# Patient Record
Sex: Female | Born: 1935 | Race: White | Hispanic: No | Smoking: Never smoker
Health system: Southern US, Community
[De-identification: ages and names within clinical notes are randomized; demographics above are authoritative.]

## PROBLEM LIST (undated history)

## (undated) DIAGNOSIS — F250 Schizoaffective disorder, bipolar type: Secondary | ICD-10-CM

## (undated) DIAGNOSIS — E7800 Pure hypercholesterolemia, unspecified: Secondary | ICD-10-CM

## (undated) DIAGNOSIS — F39 Unspecified mood [affective] disorder: Secondary | ICD-10-CM

## (undated) DIAGNOSIS — G20A1 Parkinson's disease without dyskinesia, without mention of fluctuations: Secondary | ICD-10-CM

## (undated) DIAGNOSIS — I639 Cerebral infarction, unspecified: Secondary | ICD-10-CM

## (undated) DIAGNOSIS — G20 Parkinson's disease: Secondary | ICD-10-CM

## (undated) DIAGNOSIS — I10 Essential (primary) hypertension: Secondary | ICD-10-CM

## (undated) DIAGNOSIS — I4891 Unspecified atrial fibrillation: Secondary | ICD-10-CM

## (undated) DIAGNOSIS — F0390 Unspecified dementia without behavioral disturbance: Secondary | ICD-10-CM

## (undated) DIAGNOSIS — G4700 Insomnia, unspecified: Secondary | ICD-10-CM

## (undated) DIAGNOSIS — F339 Major depressive disorder, recurrent, unspecified: Secondary | ICD-10-CM

## (undated) DIAGNOSIS — N289 Disorder of kidney and ureter, unspecified: Secondary | ICD-10-CM

## (undated) HISTORY — PX: VAGINAL HYSTERECTOMY: SUR661

---

## 2005-03-27 ENCOUNTER — Ambulatory Visit: Payer: Self-pay | Admitting: Family Medicine

## 2005-04-18 ENCOUNTER — Emergency Department: Payer: Self-pay | Admitting: Emergency Medicine

## 2005-07-24 ENCOUNTER — Ambulatory Visit: Payer: Self-pay | Admitting: Psychiatry

## 2006-02-26 ENCOUNTER — Ambulatory Visit: Payer: Self-pay | Admitting: Family Medicine

## 2006-03-11 ENCOUNTER — Ambulatory Visit: Payer: Self-pay | Admitting: Internal Medicine

## 2006-03-11 ENCOUNTER — Ambulatory Visit: Payer: Self-pay | Admitting: Family Medicine

## 2007-02-16 ENCOUNTER — Emergency Department (HOSPITAL_COMMUNITY): Admission: EM | Admit: 2007-02-16 | Discharge: 2007-02-16 | Payer: Self-pay | Admitting: Emergency Medicine

## 2007-03-31 ENCOUNTER — Ambulatory Visit: Payer: Self-pay | Admitting: Family Medicine

## 2007-08-31 ENCOUNTER — Ambulatory Visit: Payer: Self-pay | Admitting: Podiatry

## 2007-09-03 ENCOUNTER — Ambulatory Visit: Payer: Self-pay | Admitting: Podiatry

## 2007-09-14 ENCOUNTER — Ambulatory Visit: Payer: Self-pay | Admitting: Neurology

## 2009-01-04 ENCOUNTER — Ambulatory Visit: Payer: Self-pay | Admitting: Family Medicine

## 2009-01-10 ENCOUNTER — Ambulatory Visit: Payer: Self-pay | Admitting: Family Medicine

## 2009-02-12 ENCOUNTER — Ambulatory Visit: Payer: Self-pay | Admitting: Gastroenterology

## 2010-10-18 LAB — URINALYSIS, ROUTINE W REFLEX MICROSCOPIC
Glucose, UA: NEGATIVE
Ketones, ur: NEGATIVE
pH: 6.5

## 2010-10-18 LAB — COMPREHENSIVE METABOLIC PANEL
ALT: 31
AST: 92 — ABNORMAL HIGH
Albumin: 3.8
Alkaline Phosphatase: 39
Chloride: 93 — ABNORMAL LOW
Potassium: 4.1
Sodium: 128 — ABNORMAL LOW
Total Bilirubin: 0.8

## 2010-10-18 LAB — DIFFERENTIAL
Basophils Relative: 0
Eosinophils Absolute: 0
Eosinophils Relative: 0
Monocytes Absolute: 1
Monocytes Relative: 8
Neutro Abs: 10.1 — ABNORMAL HIGH

## 2010-10-18 LAB — CBC
Platelets: 229
RDW: 13.1

## 2010-10-18 LAB — URINE MICROSCOPIC-ADD ON

## 2011-02-05 DIAGNOSIS — G4710 Hypersomnia, unspecified: Secondary | ICD-10-CM | POA: Diagnosis not present

## 2011-02-13 DIAGNOSIS — H902 Conductive hearing loss, unspecified: Secondary | ICD-10-CM | POA: Diagnosis not present

## 2011-02-13 DIAGNOSIS — G4733 Obstructive sleep apnea (adult) (pediatric): Secondary | ICD-10-CM | POA: Diagnosis not present

## 2011-02-13 DIAGNOSIS — H6120 Impacted cerumen, unspecified ear: Secondary | ICD-10-CM | POA: Diagnosis not present

## 2011-02-20 DIAGNOSIS — R0602 Shortness of breath: Secondary | ICD-10-CM | POA: Diagnosis not present

## 2011-02-20 DIAGNOSIS — J449 Chronic obstructive pulmonary disease, unspecified: Secondary | ICD-10-CM | POA: Diagnosis not present

## 2011-02-20 DIAGNOSIS — G4720 Circadian rhythm sleep disorder, unspecified type: Secondary | ICD-10-CM | POA: Diagnosis not present

## 2011-02-20 DIAGNOSIS — G4730 Sleep apnea, unspecified: Secondary | ICD-10-CM | POA: Diagnosis not present

## 2011-02-21 DIAGNOSIS — G4730 Sleep apnea, unspecified: Secondary | ICD-10-CM | POA: Diagnosis not present

## 2011-02-21 DIAGNOSIS — G4720 Circadian rhythm sleep disorder, unspecified type: Secondary | ICD-10-CM | POA: Diagnosis not present

## 2011-02-24 DIAGNOSIS — I509 Heart failure, unspecified: Secondary | ICD-10-CM | POA: Diagnosis not present

## 2011-02-24 DIAGNOSIS — I4891 Unspecified atrial fibrillation: Secondary | ICD-10-CM | POA: Diagnosis not present

## 2011-02-24 DIAGNOSIS — E039 Hypothyroidism, unspecified: Secondary | ICD-10-CM | POA: Diagnosis not present

## 2011-02-25 DIAGNOSIS — F0390 Unspecified dementia without behavioral disturbance: Secondary | ICD-10-CM | POA: Diagnosis not present

## 2011-02-25 DIAGNOSIS — I129 Hypertensive chronic kidney disease with stage 1 through stage 4 chronic kidney disease, or unspecified chronic kidney disease: Secondary | ICD-10-CM | POA: Diagnosis not present

## 2011-03-03 DIAGNOSIS — Z79899 Other long term (current) drug therapy: Secondary | ICD-10-CM | POA: Diagnosis not present

## 2011-03-12 DIAGNOSIS — H4010X0 Unspecified open-angle glaucoma, stage unspecified: Secondary | ICD-10-CM | POA: Diagnosis not present

## 2011-03-12 DIAGNOSIS — H4011X0 Primary open-angle glaucoma, stage unspecified: Secondary | ICD-10-CM | POA: Diagnosis not present

## 2011-03-20 DIAGNOSIS — F314 Bipolar disorder, current episode depressed, severe, without psychotic features: Secondary | ICD-10-CM | POA: Diagnosis not present

## 2011-03-24 DIAGNOSIS — Z79899 Other long term (current) drug therapy: Secondary | ICD-10-CM | POA: Diagnosis not present

## 2011-03-25 DIAGNOSIS — J449 Chronic obstructive pulmonary disease, unspecified: Secondary | ICD-10-CM | POA: Diagnosis not present

## 2011-03-25 DIAGNOSIS — R0602 Shortness of breath: Secondary | ICD-10-CM | POA: Diagnosis not present

## 2011-03-25 DIAGNOSIS — G4730 Sleep apnea, unspecified: Secondary | ICD-10-CM | POA: Diagnosis not present

## 2011-03-25 DIAGNOSIS — G4720 Circadian rhythm sleep disorder, unspecified type: Secondary | ICD-10-CM | POA: Diagnosis not present

## 2011-04-03 DIAGNOSIS — I10 Essential (primary) hypertension: Secondary | ICD-10-CM | POA: Diagnosis not present

## 2011-04-03 DIAGNOSIS — I509 Heart failure, unspecified: Secondary | ICD-10-CM | POA: Diagnosis not present

## 2011-04-03 DIAGNOSIS — I4891 Unspecified atrial fibrillation: Secondary | ICD-10-CM | POA: Diagnosis not present

## 2011-04-03 DIAGNOSIS — I359 Nonrheumatic aortic valve disorder, unspecified: Secondary | ICD-10-CM | POA: Diagnosis not present

## 2011-04-10 DIAGNOSIS — I359 Nonrheumatic aortic valve disorder, unspecified: Secondary | ICD-10-CM | POA: Diagnosis not present

## 2011-04-10 DIAGNOSIS — I059 Rheumatic mitral valve disease, unspecified: Secondary | ICD-10-CM | POA: Diagnosis not present

## 2011-04-10 DIAGNOSIS — I4891 Unspecified atrial fibrillation: Secondary | ICD-10-CM | POA: Diagnosis not present

## 2011-04-10 DIAGNOSIS — I6350 Cerebral infarction due to unspecified occlusion or stenosis of unspecified cerebral artery: Secondary | ICD-10-CM | POA: Diagnosis not present

## 2011-05-07 DIAGNOSIS — G4710 Hypersomnia, unspecified: Secondary | ICD-10-CM | POA: Diagnosis not present

## 2011-05-13 DIAGNOSIS — I059 Rheumatic mitral valve disease, unspecified: Secondary | ICD-10-CM | POA: Diagnosis not present

## 2011-05-13 DIAGNOSIS — I4891 Unspecified atrial fibrillation: Secondary | ICD-10-CM | POA: Diagnosis not present

## 2011-05-13 DIAGNOSIS — I359 Nonrheumatic aortic valve disorder, unspecified: Secondary | ICD-10-CM | POA: Diagnosis not present

## 2011-05-13 DIAGNOSIS — I5020 Unspecified systolic (congestive) heart failure: Secondary | ICD-10-CM | POA: Diagnosis not present

## 2011-05-14 DIAGNOSIS — I4891 Unspecified atrial fibrillation: Secondary | ICD-10-CM | POA: Diagnosis not present

## 2011-05-14 DIAGNOSIS — I6350 Cerebral infarction due to unspecified occlusion or stenosis of unspecified cerebral artery: Secondary | ICD-10-CM | POA: Diagnosis not present

## 2011-05-14 DIAGNOSIS — I639 Cerebral infarction, unspecified: Secondary | ICD-10-CM | POA: Insufficient documentation

## 2011-05-14 DIAGNOSIS — E785 Hyperlipidemia, unspecified: Secondary | ICD-10-CM | POA: Diagnosis not present

## 2011-05-20 DIAGNOSIS — F314 Bipolar disorder, current episode depressed, severe, without psychotic features: Secondary | ICD-10-CM | POA: Diagnosis not present

## 2011-05-22 DIAGNOSIS — Z79899 Other long term (current) drug therapy: Secondary | ICD-10-CM | POA: Diagnosis not present

## 2011-05-22 DIAGNOSIS — F314 Bipolar disorder, current episode depressed, severe, without psychotic features: Secondary | ICD-10-CM | POA: Diagnosis not present

## 2011-06-05 DIAGNOSIS — I129 Hypertensive chronic kidney disease with stage 1 through stage 4 chronic kidney disease, or unspecified chronic kidney disease: Secondary | ICD-10-CM | POA: Diagnosis not present

## 2011-06-05 DIAGNOSIS — F0390 Unspecified dementia without behavioral disturbance: Secondary | ICD-10-CM | POA: Diagnosis not present

## 2011-06-30 DIAGNOSIS — E119 Type 2 diabetes mellitus without complications: Secondary | ICD-10-CM | POA: Diagnosis not present

## 2011-06-30 DIAGNOSIS — Z79899 Other long term (current) drug therapy: Secondary | ICD-10-CM | POA: Diagnosis not present

## 2011-06-30 DIAGNOSIS — E782 Mixed hyperlipidemia: Secondary | ICD-10-CM | POA: Diagnosis not present

## 2011-06-30 DIAGNOSIS — D649 Anemia, unspecified: Secondary | ICD-10-CM | POA: Diagnosis not present

## 2011-07-08 DIAGNOSIS — R6889 Other general symptoms and signs: Secondary | ICD-10-CM | POA: Diagnosis not present

## 2011-08-27 DIAGNOSIS — G4710 Hypersomnia, unspecified: Secondary | ICD-10-CM | POA: Diagnosis not present

## 2011-09-01 DIAGNOSIS — D649 Anemia, unspecified: Secondary | ICD-10-CM | POA: Diagnosis not present

## 2011-09-01 DIAGNOSIS — E039 Hypothyroidism, unspecified: Secondary | ICD-10-CM | POA: Diagnosis not present

## 2011-09-01 DIAGNOSIS — Z79899 Other long term (current) drug therapy: Secondary | ICD-10-CM | POA: Diagnosis not present

## 2011-09-09 DIAGNOSIS — F314 Bipolar disorder, current episode depressed, severe, without psychotic features: Secondary | ICD-10-CM | POA: Diagnosis not present

## 2011-09-11 DIAGNOSIS — E039 Hypothyroidism, unspecified: Secondary | ICD-10-CM | POA: Diagnosis not present

## 2011-09-11 DIAGNOSIS — Z79899 Other long term (current) drug therapy: Secondary | ICD-10-CM | POA: Diagnosis not present

## 2011-09-15 DIAGNOSIS — R011 Cardiac murmur, unspecified: Secondary | ICD-10-CM | POA: Diagnosis not present

## 2011-09-15 DIAGNOSIS — I4891 Unspecified atrial fibrillation: Secondary | ICD-10-CM | POA: Diagnosis not present

## 2011-09-15 DIAGNOSIS — I359 Nonrheumatic aortic valve disorder, unspecified: Secondary | ICD-10-CM | POA: Diagnosis not present

## 2011-09-15 DIAGNOSIS — Z79899 Other long term (current) drug therapy: Secondary | ICD-10-CM | POA: Diagnosis not present

## 2011-09-15 DIAGNOSIS — I509 Heart failure, unspecified: Secondary | ICD-10-CM | POA: Diagnosis not present

## 2011-09-15 DIAGNOSIS — I059 Rheumatic mitral valve disease, unspecified: Secondary | ICD-10-CM | POA: Diagnosis not present

## 2011-09-16 DIAGNOSIS — H4010X0 Unspecified open-angle glaucoma, stage unspecified: Secondary | ICD-10-CM | POA: Diagnosis not present

## 2011-09-16 DIAGNOSIS — H4011X0 Primary open-angle glaucoma, stage unspecified: Secondary | ICD-10-CM | POA: Diagnosis not present

## 2011-09-22 DIAGNOSIS — J449 Chronic obstructive pulmonary disease, unspecified: Secondary | ICD-10-CM | POA: Diagnosis not present

## 2011-09-22 DIAGNOSIS — G4730 Sleep apnea, unspecified: Secondary | ICD-10-CM | POA: Diagnosis not present

## 2011-09-22 DIAGNOSIS — G4720 Circadian rhythm sleep disorder, unspecified type: Secondary | ICD-10-CM | POA: Diagnosis not present

## 2011-11-05 DIAGNOSIS — G4730 Sleep apnea, unspecified: Secondary | ICD-10-CM | POA: Diagnosis not present

## 2011-11-05 DIAGNOSIS — G4710 Hypersomnia, unspecified: Secondary | ICD-10-CM | POA: Diagnosis not present

## 2011-11-11 DIAGNOSIS — F314 Bipolar disorder, current episode depressed, severe, without psychotic features: Secondary | ICD-10-CM | POA: Diagnosis not present

## 2011-11-13 DIAGNOSIS — Z79899 Other long term (current) drug therapy: Secondary | ICD-10-CM | POA: Diagnosis not present

## 2011-11-13 DIAGNOSIS — D649 Anemia, unspecified: Secondary | ICD-10-CM | POA: Diagnosis not present

## 2011-11-25 DIAGNOSIS — I4891 Unspecified atrial fibrillation: Secondary | ICD-10-CM | POA: Diagnosis not present

## 2011-11-25 DIAGNOSIS — I6350 Cerebral infarction due to unspecified occlusion or stenosis of unspecified cerebral artery: Secondary | ICD-10-CM | POA: Diagnosis not present

## 2011-11-25 DIAGNOSIS — R0602 Shortness of breath: Secondary | ICD-10-CM | POA: Diagnosis not present

## 2011-11-25 DIAGNOSIS — I2510 Atherosclerotic heart disease of native coronary artery without angina pectoris: Secondary | ICD-10-CM | POA: Diagnosis not present

## 2011-11-25 DIAGNOSIS — I059 Rheumatic mitral valve disease, unspecified: Secondary | ICD-10-CM | POA: Diagnosis not present

## 2011-11-25 DIAGNOSIS — R609 Edema, unspecified: Secondary | ICD-10-CM | POA: Diagnosis not present

## 2011-11-25 DIAGNOSIS — E785 Hyperlipidemia, unspecified: Secondary | ICD-10-CM | POA: Diagnosis not present

## 2011-11-25 DIAGNOSIS — I423 Endomyocardial (eosinophilic) disease: Secondary | ICD-10-CM | POA: Diagnosis not present

## 2011-12-02 DIAGNOSIS — R0602 Shortness of breath: Secondary | ICD-10-CM | POA: Diagnosis not present

## 2011-12-02 DIAGNOSIS — I428 Other cardiomyopathies: Secondary | ICD-10-CM | POA: Diagnosis not present

## 2011-12-02 DIAGNOSIS — I509 Heart failure, unspecified: Secondary | ICD-10-CM | POA: Diagnosis not present

## 2012-01-01 DIAGNOSIS — Z79899 Other long term (current) drug therapy: Secondary | ICD-10-CM | POA: Diagnosis not present

## 2012-02-02 DIAGNOSIS — F314 Bipolar disorder, current episode depressed, severe, without psychotic features: Secondary | ICD-10-CM | POA: Diagnosis not present

## 2012-03-01 DIAGNOSIS — F329 Major depressive disorder, single episode, unspecified: Secondary | ICD-10-CM | POA: Diagnosis not present

## 2012-03-01 DIAGNOSIS — Z0000 Encounter for general adult medical examination without abnormal findings: Secondary | ICD-10-CM | POA: Diagnosis not present

## 2012-03-01 DIAGNOSIS — G4700 Insomnia, unspecified: Secondary | ICD-10-CM | POA: Diagnosis not present

## 2012-03-01 DIAGNOSIS — E890 Postprocedural hypothyroidism: Secondary | ICD-10-CM | POA: Diagnosis not present

## 2012-03-01 DIAGNOSIS — Z79899 Other long term (current) drug therapy: Secondary | ICD-10-CM | POA: Diagnosis not present

## 2012-03-01 DIAGNOSIS — F39 Unspecified mood [affective] disorder: Secondary | ICD-10-CM | POA: Diagnosis not present

## 2012-03-01 DIAGNOSIS — E7800 Pure hypercholesterolemia, unspecified: Secondary | ICD-10-CM | POA: Diagnosis not present

## 2012-03-05 DIAGNOSIS — G4700 Insomnia, unspecified: Secondary | ICD-10-CM | POA: Diagnosis not present

## 2012-03-05 DIAGNOSIS — F39 Unspecified mood [affective] disorder: Secondary | ICD-10-CM | POA: Diagnosis not present

## 2012-03-05 DIAGNOSIS — F329 Major depressive disorder, single episode, unspecified: Secondary | ICD-10-CM | POA: Diagnosis not present

## 2012-03-13 DIAGNOSIS — F0390 Unspecified dementia without behavioral disturbance: Secondary | ICD-10-CM | POA: Diagnosis not present

## 2012-03-13 DIAGNOSIS — I129 Hypertensive chronic kidney disease with stage 1 through stage 4 chronic kidney disease, or unspecified chronic kidney disease: Secondary | ICD-10-CM | POA: Diagnosis not present

## 2012-03-22 DIAGNOSIS — Z006 Encounter for examination for normal comparison and control in clinical research program: Secondary | ICD-10-CM | POA: Diagnosis not present

## 2012-03-22 DIAGNOSIS — G4730 Sleep apnea, unspecified: Secondary | ICD-10-CM | POA: Diagnosis not present

## 2012-03-22 DIAGNOSIS — I4891 Unspecified atrial fibrillation: Secondary | ICD-10-CM | POA: Diagnosis not present

## 2012-03-22 DIAGNOSIS — G4710 Hypersomnia, unspecified: Secondary | ICD-10-CM | POA: Diagnosis not present

## 2012-03-31 DIAGNOSIS — H4011X0 Primary open-angle glaucoma, stage unspecified: Secondary | ICD-10-CM | POA: Diagnosis not present

## 2012-03-31 DIAGNOSIS — Z961 Presence of intraocular lens: Secondary | ICD-10-CM | POA: Diagnosis not present

## 2012-04-02 DIAGNOSIS — I359 Nonrheumatic aortic valve disorder, unspecified: Secondary | ICD-10-CM | POA: Diagnosis not present

## 2012-04-02 DIAGNOSIS — I4891 Unspecified atrial fibrillation: Secondary | ICD-10-CM | POA: Diagnosis not present

## 2012-04-02 DIAGNOSIS — I059 Rheumatic mitral valve disease, unspecified: Secondary | ICD-10-CM | POA: Diagnosis not present

## 2012-04-02 DIAGNOSIS — I10 Essential (primary) hypertension: Secondary | ICD-10-CM | POA: Diagnosis not present

## 2012-04-08 DIAGNOSIS — R072 Precordial pain: Secondary | ICD-10-CM | POA: Diagnosis not present

## 2012-04-12 DIAGNOSIS — I059 Rheumatic mitral valve disease, unspecified: Secondary | ICD-10-CM | POA: Diagnosis not present

## 2012-04-12 DIAGNOSIS — I509 Heart failure, unspecified: Secondary | ICD-10-CM | POA: Diagnosis not present

## 2012-04-12 DIAGNOSIS — I4891 Unspecified atrial fibrillation: Secondary | ICD-10-CM | POA: Diagnosis not present

## 2012-04-12 DIAGNOSIS — I10 Essential (primary) hypertension: Secondary | ICD-10-CM | POA: Diagnosis not present

## 2012-04-16 DIAGNOSIS — I10 Essential (primary) hypertension: Secondary | ICD-10-CM | POA: Diagnosis not present

## 2012-04-16 DIAGNOSIS — R9430 Abnormal result of cardiovascular function study, unspecified: Secondary | ICD-10-CM | POA: Diagnosis not present

## 2012-04-16 DIAGNOSIS — I509 Heart failure, unspecified: Secondary | ICD-10-CM | POA: Diagnosis not present

## 2012-04-16 DIAGNOSIS — Z79899 Other long term (current) drug therapy: Secondary | ICD-10-CM | POA: Diagnosis not present

## 2012-04-16 DIAGNOSIS — I4891 Unspecified atrial fibrillation: Secondary | ICD-10-CM | POA: Diagnosis not present

## 2012-04-20 DIAGNOSIS — I059 Rheumatic mitral valve disease, unspecified: Secondary | ICD-10-CM | POA: Diagnosis not present

## 2012-04-20 DIAGNOSIS — I4891 Unspecified atrial fibrillation: Secondary | ICD-10-CM | POA: Diagnosis not present

## 2012-04-20 DIAGNOSIS — I509 Heart failure, unspecified: Secondary | ICD-10-CM | POA: Diagnosis not present

## 2012-04-20 DIAGNOSIS — I359 Nonrheumatic aortic valve disorder, unspecified: Secondary | ICD-10-CM | POA: Diagnosis not present

## 2012-04-21 DIAGNOSIS — H4010X0 Unspecified open-angle glaucoma, stage unspecified: Secondary | ICD-10-CM | POA: Diagnosis not present

## 2012-04-21 DIAGNOSIS — H4011X0 Primary open-angle glaucoma, stage unspecified: Secondary | ICD-10-CM | POA: Diagnosis not present

## 2012-04-28 DIAGNOSIS — F314 Bipolar disorder, current episode depressed, severe, without psychotic features: Secondary | ICD-10-CM | POA: Diagnosis not present

## 2012-05-10 DIAGNOSIS — F39 Unspecified mood [affective] disorder: Secondary | ICD-10-CM | POA: Diagnosis not present

## 2012-05-10 DIAGNOSIS — G4700 Insomnia, unspecified: Secondary | ICD-10-CM | POA: Diagnosis not present

## 2012-05-10 DIAGNOSIS — F329 Major depressive disorder, single episode, unspecified: Secondary | ICD-10-CM | POA: Diagnosis not present

## 2012-06-28 DIAGNOSIS — Z0000 Encounter for general adult medical examination without abnormal findings: Secondary | ICD-10-CM | POA: Diagnosis not present

## 2012-06-28 DIAGNOSIS — Z79899 Other long term (current) drug therapy: Secondary | ICD-10-CM | POA: Diagnosis not present

## 2012-08-02 DIAGNOSIS — F39 Unspecified mood [affective] disorder: Secondary | ICD-10-CM | POA: Diagnosis not present

## 2012-08-13 DIAGNOSIS — F39 Unspecified mood [affective] disorder: Secondary | ICD-10-CM | POA: Diagnosis not present

## 2012-08-26 DIAGNOSIS — Z79899 Other long term (current) drug therapy: Secondary | ICD-10-CM | POA: Diagnosis not present

## 2012-08-26 DIAGNOSIS — F314 Bipolar disorder, current episode depressed, severe, without psychotic features: Secondary | ICD-10-CM | POA: Diagnosis not present

## 2012-08-31 DIAGNOSIS — Z79899 Other long term (current) drug therapy: Secondary | ICD-10-CM | POA: Diagnosis not present

## 2012-08-31 DIAGNOSIS — Z0000 Encounter for general adult medical examination without abnormal findings: Secondary | ICD-10-CM | POA: Diagnosis not present

## 2012-09-15 DIAGNOSIS — B351 Tinea unguium: Secondary | ICD-10-CM | POA: Diagnosis not present

## 2012-09-15 DIAGNOSIS — E119 Type 2 diabetes mellitus without complications: Secondary | ICD-10-CM | POA: Diagnosis not present

## 2012-09-18 DIAGNOSIS — I129 Hypertensive chronic kidney disease with stage 1 through stage 4 chronic kidney disease, or unspecified chronic kidney disease: Secondary | ICD-10-CM | POA: Diagnosis not present

## 2012-09-18 DIAGNOSIS — F0390 Unspecified dementia without behavioral disturbance: Secondary | ICD-10-CM | POA: Diagnosis not present

## 2012-10-13 DIAGNOSIS — Z961 Presence of intraocular lens: Secondary | ICD-10-CM | POA: Diagnosis not present

## 2012-10-13 DIAGNOSIS — H4011X0 Primary open-angle glaucoma, stage unspecified: Secondary | ICD-10-CM | POA: Diagnosis not present

## 2012-11-01 DIAGNOSIS — D649 Anemia, unspecified: Secondary | ICD-10-CM | POA: Diagnosis not present

## 2012-11-01 DIAGNOSIS — Z79899 Other long term (current) drug therapy: Secondary | ICD-10-CM | POA: Diagnosis not present

## 2012-11-01 DIAGNOSIS — F329 Major depressive disorder, single episode, unspecified: Secondary | ICD-10-CM | POA: Diagnosis not present

## 2012-11-01 DIAGNOSIS — E039 Hypothyroidism, unspecified: Secondary | ICD-10-CM | POA: Diagnosis not present

## 2012-11-01 DIAGNOSIS — F39 Unspecified mood [affective] disorder: Secondary | ICD-10-CM | POA: Diagnosis not present

## 2012-11-01 DIAGNOSIS — Z0000 Encounter for general adult medical examination without abnormal findings: Secondary | ICD-10-CM | POA: Diagnosis not present

## 2012-11-01 DIAGNOSIS — G4700 Insomnia, unspecified: Secondary | ICD-10-CM | POA: Diagnosis not present

## 2012-11-24 DIAGNOSIS — G4710 Hypersomnia, unspecified: Secondary | ICD-10-CM | POA: Diagnosis not present

## 2012-11-25 DIAGNOSIS — Z23 Encounter for immunization: Secondary | ICD-10-CM | POA: Diagnosis not present

## 2012-11-25 DIAGNOSIS — F0280 Dementia in other diseases classified elsewhere without behavioral disturbance: Secondary | ICD-10-CM | POA: Diagnosis not present

## 2012-11-29 DIAGNOSIS — G4700 Insomnia, unspecified: Secondary | ICD-10-CM | POA: Diagnosis not present

## 2012-11-29 DIAGNOSIS — F329 Major depressive disorder, single episode, unspecified: Secondary | ICD-10-CM | POA: Diagnosis not present

## 2012-11-29 DIAGNOSIS — F39 Unspecified mood [affective] disorder: Secondary | ICD-10-CM | POA: Diagnosis not present

## 2012-12-02 DIAGNOSIS — D649 Anemia, unspecified: Secondary | ICD-10-CM | POA: Diagnosis not present

## 2012-12-02 DIAGNOSIS — Z0000 Encounter for general adult medical examination without abnormal findings: Secondary | ICD-10-CM | POA: Diagnosis not present

## 2012-12-02 DIAGNOSIS — Z79899 Other long term (current) drug therapy: Secondary | ICD-10-CM | POA: Diagnosis not present

## 2012-12-16 DIAGNOSIS — F314 Bipolar disorder, current episode depressed, severe, without psychotic features: Secondary | ICD-10-CM | POA: Diagnosis not present

## 2013-01-03 DIAGNOSIS — E7800 Pure hypercholesterolemia, unspecified: Secondary | ICD-10-CM | POA: Diagnosis not present

## 2013-01-03 DIAGNOSIS — Z0000 Encounter for general adult medical examination without abnormal findings: Secondary | ICD-10-CM | POA: Diagnosis not present

## 2013-01-03 DIAGNOSIS — D649 Anemia, unspecified: Secondary | ICD-10-CM | POA: Diagnosis not present

## 2013-01-03 DIAGNOSIS — E119 Type 2 diabetes mellitus without complications: Secondary | ICD-10-CM | POA: Diagnosis not present

## 2013-01-03 DIAGNOSIS — Z79899 Other long term (current) drug therapy: Secondary | ICD-10-CM | POA: Diagnosis not present

## 2013-02-04 DIAGNOSIS — F3289 Other specified depressive episodes: Secondary | ICD-10-CM | POA: Diagnosis not present

## 2013-02-04 DIAGNOSIS — F329 Major depressive disorder, single episode, unspecified: Secondary | ICD-10-CM | POA: Diagnosis not present

## 2013-02-04 DIAGNOSIS — F39 Unspecified mood [affective] disorder: Secondary | ICD-10-CM | POA: Diagnosis not present

## 2013-02-04 DIAGNOSIS — G4700 Insomnia, unspecified: Secondary | ICD-10-CM | POA: Diagnosis not present

## 2013-02-25 DIAGNOSIS — F0393 Unspecified dementia, unspecified severity, with mood disturbance: Secondary | ICD-10-CM | POA: Diagnosis not present

## 2013-02-25 DIAGNOSIS — F0390 Unspecified dementia without behavioral disturbance: Secondary | ICD-10-CM | POA: Diagnosis not present

## 2013-02-25 DIAGNOSIS — I129 Hypertensive chronic kidney disease with stage 1 through stage 4 chronic kidney disease, or unspecified chronic kidney disease: Secondary | ICD-10-CM | POA: Diagnosis not present

## 2013-02-25 DIAGNOSIS — N183 Chronic kidney disease, stage 3 unspecified: Secondary | ICD-10-CM | POA: Diagnosis not present

## 2013-02-28 DIAGNOSIS — F329 Major depressive disorder, single episode, unspecified: Secondary | ICD-10-CM | POA: Diagnosis not present

## 2013-02-28 DIAGNOSIS — F3289 Other specified depressive episodes: Secondary | ICD-10-CM | POA: Diagnosis not present

## 2013-02-28 DIAGNOSIS — F39 Unspecified mood [affective] disorder: Secondary | ICD-10-CM | POA: Diagnosis not present

## 2013-02-28 DIAGNOSIS — G4700 Insomnia, unspecified: Secondary | ICD-10-CM | POA: Diagnosis not present

## 2013-03-07 DIAGNOSIS — Z0000 Encounter for general adult medical examination without abnormal findings: Secondary | ICD-10-CM | POA: Diagnosis not present

## 2013-03-07 DIAGNOSIS — D649 Anemia, unspecified: Secondary | ICD-10-CM | POA: Diagnosis not present

## 2013-03-07 DIAGNOSIS — Z79899 Other long term (current) drug therapy: Secondary | ICD-10-CM | POA: Diagnosis not present

## 2013-03-10 DIAGNOSIS — Z79899 Other long term (current) drug therapy: Secondary | ICD-10-CM | POA: Diagnosis not present

## 2013-03-10 DIAGNOSIS — F314 Bipolar disorder, current episode depressed, severe, without psychotic features: Secondary | ICD-10-CM | POA: Diagnosis not present

## 2013-03-21 DIAGNOSIS — Z0000 Encounter for general adult medical examination without abnormal findings: Secondary | ICD-10-CM | POA: Diagnosis not present

## 2013-03-21 DIAGNOSIS — Z79899 Other long term (current) drug therapy: Secondary | ICD-10-CM | POA: Diagnosis not present

## 2013-03-22 DIAGNOSIS — I10 Essential (primary) hypertension: Secondary | ICD-10-CM | POA: Diagnosis not present

## 2013-04-07 DIAGNOSIS — Z79899 Other long term (current) drug therapy: Secondary | ICD-10-CM | POA: Diagnosis not present

## 2013-04-07 DIAGNOSIS — F314 Bipolar disorder, current episode depressed, severe, without psychotic features: Secondary | ICD-10-CM | POA: Diagnosis not present

## 2013-04-11 DIAGNOSIS — Z79899 Other long term (current) drug therapy: Secondary | ICD-10-CM | POA: Diagnosis not present

## 2013-04-11 DIAGNOSIS — Z0000 Encounter for general adult medical examination without abnormal findings: Secondary | ICD-10-CM | POA: Diagnosis not present

## 2013-04-11 DIAGNOSIS — E119 Type 2 diabetes mellitus without complications: Secondary | ICD-10-CM | POA: Diagnosis not present

## 2013-04-20 DIAGNOSIS — H4011X0 Primary open-angle glaucoma, stage unspecified: Secondary | ICD-10-CM | POA: Diagnosis not present

## 2013-04-20 DIAGNOSIS — Z961 Presence of intraocular lens: Secondary | ICD-10-CM | POA: Diagnosis not present

## 2013-04-20 DIAGNOSIS — H409 Unspecified glaucoma: Secondary | ICD-10-CM | POA: Diagnosis not present

## 2013-05-05 DIAGNOSIS — Z79899 Other long term (current) drug therapy: Secondary | ICD-10-CM | POA: Diagnosis not present

## 2013-05-05 DIAGNOSIS — Z0000 Encounter for general adult medical examination without abnormal findings: Secondary | ICD-10-CM | POA: Diagnosis not present

## 2013-05-09 DIAGNOSIS — F3289 Other specified depressive episodes: Secondary | ICD-10-CM | POA: Diagnosis not present

## 2013-05-09 DIAGNOSIS — F39 Unspecified mood [affective] disorder: Secondary | ICD-10-CM | POA: Diagnosis not present

## 2013-05-09 DIAGNOSIS — G4700 Insomnia, unspecified: Secondary | ICD-10-CM | POA: Diagnosis not present

## 2013-05-09 DIAGNOSIS — F329 Major depressive disorder, single episode, unspecified: Secondary | ICD-10-CM | POA: Diagnosis not present

## 2013-05-17 DIAGNOSIS — F3289 Other specified depressive episodes: Secondary | ICD-10-CM | POA: Diagnosis not present

## 2013-05-17 DIAGNOSIS — F39 Unspecified mood [affective] disorder: Secondary | ICD-10-CM | POA: Diagnosis not present

## 2013-05-17 DIAGNOSIS — G4700 Insomnia, unspecified: Secondary | ICD-10-CM | POA: Diagnosis not present

## 2013-05-17 DIAGNOSIS — F329 Major depressive disorder, single episode, unspecified: Secondary | ICD-10-CM | POA: Diagnosis not present

## 2013-06-02 DIAGNOSIS — F319 Bipolar disorder, unspecified: Secondary | ICD-10-CM | POA: Diagnosis not present

## 2013-06-24 DIAGNOSIS — I129 Hypertensive chronic kidney disease with stage 1 through stage 4 chronic kidney disease, or unspecified chronic kidney disease: Secondary | ICD-10-CM | POA: Diagnosis not present

## 2013-06-24 DIAGNOSIS — N183 Chronic kidney disease, stage 3 unspecified: Secondary | ICD-10-CM | POA: Diagnosis not present

## 2013-06-24 DIAGNOSIS — F0390 Unspecified dementia without behavioral disturbance: Secondary | ICD-10-CM | POA: Diagnosis not present

## 2013-06-24 DIAGNOSIS — F0393 Unspecified dementia, unspecified severity, with mood disturbance: Secondary | ICD-10-CM | POA: Diagnosis not present

## 2013-06-24 DIAGNOSIS — I4891 Unspecified atrial fibrillation: Secondary | ICD-10-CM | POA: Diagnosis not present

## 2013-07-05 DIAGNOSIS — Z79899 Other long term (current) drug therapy: Secondary | ICD-10-CM | POA: Diagnosis not present

## 2013-07-05 DIAGNOSIS — D649 Anemia, unspecified: Secondary | ICD-10-CM | POA: Diagnosis not present

## 2013-07-05 DIAGNOSIS — Z0000 Encounter for general adult medical examination without abnormal findings: Secondary | ICD-10-CM | POA: Diagnosis not present

## 2013-07-25 DIAGNOSIS — F39 Unspecified mood [affective] disorder: Secondary | ICD-10-CM | POA: Diagnosis not present

## 2013-07-25 DIAGNOSIS — F329 Major depressive disorder, single episode, unspecified: Secondary | ICD-10-CM | POA: Diagnosis not present

## 2013-07-25 DIAGNOSIS — G4700 Insomnia, unspecified: Secondary | ICD-10-CM | POA: Diagnosis not present

## 2013-07-25 DIAGNOSIS — F3289 Other specified depressive episodes: Secondary | ICD-10-CM | POA: Diagnosis not present

## 2013-07-28 DIAGNOSIS — E119 Type 2 diabetes mellitus without complications: Secondary | ICD-10-CM | POA: Diagnosis not present

## 2013-07-28 DIAGNOSIS — G4730 Sleep apnea, unspecified: Secondary | ICD-10-CM | POA: Diagnosis not present

## 2013-07-28 DIAGNOSIS — B351 Tinea unguium: Secondary | ICD-10-CM | POA: Diagnosis not present

## 2013-07-28 DIAGNOSIS — I739 Peripheral vascular disease, unspecified: Secondary | ICD-10-CM | POA: Diagnosis not present

## 2013-07-28 DIAGNOSIS — G4710 Hypersomnia, unspecified: Secondary | ICD-10-CM | POA: Diagnosis not present

## 2013-07-28 DIAGNOSIS — M79609 Pain in unspecified limb: Secondary | ICD-10-CM | POA: Diagnosis not present

## 2013-09-01 DIAGNOSIS — F319 Bipolar disorder, unspecified: Secondary | ICD-10-CM | POA: Diagnosis not present

## 2013-09-02 DIAGNOSIS — Z79899 Other long term (current) drug therapy: Secondary | ICD-10-CM | POA: Diagnosis not present

## 2013-09-02 DIAGNOSIS — F319 Bipolar disorder, unspecified: Secondary | ICD-10-CM | POA: Diagnosis not present

## 2013-10-01 DIAGNOSIS — F0280 Dementia in other diseases classified elsewhere without behavioral disturbance: Secondary | ICD-10-CM | POA: Diagnosis not present

## 2013-10-01 DIAGNOSIS — G309 Alzheimer's disease, unspecified: Secondary | ICD-10-CM | POA: Diagnosis not present

## 2013-10-01 DIAGNOSIS — I69998 Other sequelae following unspecified cerebrovascular disease: Secondary | ICD-10-CM | POA: Diagnosis not present

## 2013-10-01 DIAGNOSIS — I10 Essential (primary) hypertension: Secondary | ICD-10-CM | POA: Diagnosis not present

## 2013-10-01 DIAGNOSIS — IMO0001 Reserved for inherently not codable concepts without codable children: Secondary | ICD-10-CM | POA: Diagnosis not present

## 2013-10-03 DIAGNOSIS — F39 Unspecified mood [affective] disorder: Secondary | ICD-10-CM | POA: Diagnosis not present

## 2013-10-03 DIAGNOSIS — F329 Major depressive disorder, single episode, unspecified: Secondary | ICD-10-CM | POA: Diagnosis not present

## 2013-10-03 DIAGNOSIS — F3289 Other specified depressive episodes: Secondary | ICD-10-CM | POA: Diagnosis not present

## 2013-10-03 DIAGNOSIS — G4700 Insomnia, unspecified: Secondary | ICD-10-CM | POA: Diagnosis not present

## 2013-10-06 DIAGNOSIS — D649 Anemia, unspecified: Secondary | ICD-10-CM | POA: Diagnosis not present

## 2013-10-27 DIAGNOSIS — E119 Type 2 diabetes mellitus without complications: Secondary | ICD-10-CM | POA: Diagnosis not present

## 2013-10-27 DIAGNOSIS — M79675 Pain in left toe(s): Secondary | ICD-10-CM | POA: Diagnosis not present

## 2013-10-27 DIAGNOSIS — M79674 Pain in right toe(s): Secondary | ICD-10-CM | POA: Diagnosis not present

## 2013-10-27 DIAGNOSIS — G20 Parkinson's disease: Secondary | ICD-10-CM | POA: Diagnosis not present

## 2013-10-27 DIAGNOSIS — B351 Tinea unguium: Secondary | ICD-10-CM | POA: Diagnosis not present

## 2013-10-31 DIAGNOSIS — Z5181 Encounter for therapeutic drug level monitoring: Secondary | ICD-10-CM | POA: Diagnosis not present

## 2013-11-14 DIAGNOSIS — F329 Major depressive disorder, single episode, unspecified: Secondary | ICD-10-CM | POA: Diagnosis not present

## 2013-11-14 DIAGNOSIS — F39 Unspecified mood [affective] disorder: Secondary | ICD-10-CM | POA: Diagnosis not present

## 2013-11-29 DIAGNOSIS — E119 Type 2 diabetes mellitus without complications: Secondary | ICD-10-CM | POA: Diagnosis not present

## 2013-12-01 DIAGNOSIS — F319 Bipolar disorder, unspecified: Secondary | ICD-10-CM | POA: Diagnosis not present

## 2013-12-29 DIAGNOSIS — D649 Anemia, unspecified: Secondary | ICD-10-CM | POA: Diagnosis not present

## 2013-12-29 DIAGNOSIS — E039 Hypothyroidism, unspecified: Secondary | ICD-10-CM | POA: Diagnosis not present

## 2013-12-29 DIAGNOSIS — Z5181 Encounter for therapeutic drug level monitoring: Secondary | ICD-10-CM | POA: Diagnosis not present

## 2013-12-29 DIAGNOSIS — E780 Pure hypercholesterolemia: Secondary | ICD-10-CM | POA: Diagnosis not present

## 2014-02-04 DIAGNOSIS — I4891 Unspecified atrial fibrillation: Secondary | ICD-10-CM | POA: Diagnosis not present

## 2014-02-04 DIAGNOSIS — F0280 Dementia in other diseases classified elsewhere without behavioral disturbance: Secondary | ICD-10-CM | POA: Diagnosis not present

## 2014-02-04 DIAGNOSIS — I6789 Other cerebrovascular disease: Secondary | ICD-10-CM | POA: Diagnosis not present

## 2014-02-04 DIAGNOSIS — E119 Type 2 diabetes mellitus without complications: Secondary | ICD-10-CM | POA: Diagnosis not present

## 2014-02-07 DIAGNOSIS — R262 Difficulty in walking, not elsewhere classified: Secondary | ICD-10-CM | POA: Diagnosis not present

## 2014-02-07 DIAGNOSIS — B351 Tinea unguium: Secondary | ICD-10-CM | POA: Diagnosis not present

## 2014-02-07 DIAGNOSIS — E119 Type 2 diabetes mellitus without complications: Secondary | ICD-10-CM | POA: Diagnosis not present

## 2014-02-07 DIAGNOSIS — M79674 Pain in right toe(s): Secondary | ICD-10-CM | POA: Diagnosis not present

## 2014-02-07 DIAGNOSIS — M79675 Pain in left toe(s): Secondary | ICD-10-CM | POA: Diagnosis not present

## 2014-02-20 DIAGNOSIS — F329 Major depressive disorder, single episode, unspecified: Secondary | ICD-10-CM | POA: Diagnosis not present

## 2014-02-20 DIAGNOSIS — F39 Unspecified mood [affective] disorder: Secondary | ICD-10-CM | POA: Diagnosis not present

## 2014-03-01 DIAGNOSIS — E119 Type 2 diabetes mellitus without complications: Secondary | ICD-10-CM | POA: Diagnosis not present

## 2014-03-01 DIAGNOSIS — H4011X4 Primary open-angle glaucoma, indeterminate stage: Secondary | ICD-10-CM | POA: Diagnosis not present

## 2014-03-01 DIAGNOSIS — Z961 Presence of intraocular lens: Secondary | ICD-10-CM | POA: Diagnosis not present

## 2014-03-02 DIAGNOSIS — F319 Bipolar disorder, unspecified: Secondary | ICD-10-CM | POA: Diagnosis not present

## 2014-03-06 DIAGNOSIS — Z79899 Other long term (current) drug therapy: Secondary | ICD-10-CM | POA: Diagnosis not present

## 2014-03-06 DIAGNOSIS — F319 Bipolar disorder, unspecified: Secondary | ICD-10-CM | POA: Diagnosis not present

## 2014-03-25 DIAGNOSIS — R05 Cough: Secondary | ICD-10-CM | POA: Diagnosis not present

## 2014-03-28 DIAGNOSIS — Z5181 Encounter for therapeutic drug level monitoring: Secondary | ICD-10-CM | POA: Diagnosis not present

## 2014-03-28 DIAGNOSIS — E1165 Type 2 diabetes mellitus with hyperglycemia: Secondary | ICD-10-CM | POA: Diagnosis not present

## 2014-04-17 DIAGNOSIS — G4700 Insomnia, unspecified: Secondary | ICD-10-CM | POA: Diagnosis not present

## 2014-04-17 DIAGNOSIS — F39 Unspecified mood [affective] disorder: Secondary | ICD-10-CM | POA: Diagnosis not present

## 2014-04-17 DIAGNOSIS — F332 Major depressive disorder, recurrent severe without psychotic features: Secondary | ICD-10-CM | POA: Diagnosis not present

## 2014-05-02 DIAGNOSIS — Z5181 Encounter for therapeutic drug level monitoring: Secondary | ICD-10-CM | POA: Diagnosis not present

## 2014-05-23 DIAGNOSIS — B351 Tinea unguium: Secondary | ICD-10-CM | POA: Diagnosis not present

## 2014-05-23 DIAGNOSIS — E119 Type 2 diabetes mellitus without complications: Secondary | ICD-10-CM | POA: Diagnosis not present

## 2014-05-23 DIAGNOSIS — M79674 Pain in right toe(s): Secondary | ICD-10-CM | POA: Diagnosis not present

## 2014-05-23 DIAGNOSIS — R262 Difficulty in walking, not elsewhere classified: Secondary | ICD-10-CM | POA: Diagnosis not present

## 2014-05-23 DIAGNOSIS — M79675 Pain in left toe(s): Secondary | ICD-10-CM | POA: Diagnosis not present

## 2014-05-31 DIAGNOSIS — F319 Bipolar disorder, unspecified: Secondary | ICD-10-CM | POA: Diagnosis not present

## 2014-06-29 DIAGNOSIS — Z5181 Encounter for therapeutic drug level monitoring: Secondary | ICD-10-CM | POA: Diagnosis not present

## 2014-06-29 DIAGNOSIS — E039 Hypothyroidism, unspecified: Secondary | ICD-10-CM | POA: Diagnosis not present

## 2014-06-29 DIAGNOSIS — Z008 Encounter for other general examination: Secondary | ICD-10-CM | POA: Diagnosis not present

## 2014-06-29 DIAGNOSIS — D649 Anemia, unspecified: Secondary | ICD-10-CM | POA: Diagnosis not present

## 2014-06-30 ENCOUNTER — Inpatient Hospital Stay
Admission: EM | Admit: 2014-06-30 | Discharge: 2014-07-05 | DRG: 871 | Disposition: A | Discharge status: Skilled Nursing Facility | Payer: Medicare Other | Attending: Internal Medicine | Admitting: Internal Medicine

## 2014-06-30 ENCOUNTER — Emergency Department: Payer: Medicare Other

## 2014-06-30 ENCOUNTER — Encounter: Payer: Self-pay | Admitting: Occupational Medicine

## 2014-06-30 ENCOUNTER — Inpatient Hospital Stay: Payer: Medicare Other

## 2014-06-30 DIAGNOSIS — R0902 Hypoxemia: Secondary | ICD-10-CM | POA: Diagnosis present

## 2014-06-30 DIAGNOSIS — K8000 Calculus of gallbladder with acute cholecystitis without obstruction: Secondary | ICD-10-CM | POA: Diagnosis present

## 2014-06-30 DIAGNOSIS — I10 Essential (primary) hypertension: Secondary | ICD-10-CM | POA: Diagnosis not present

## 2014-06-30 DIAGNOSIS — F329 Major depressive disorder, single episode, unspecified: Secondary | ICD-10-CM | POA: Diagnosis not present

## 2014-06-30 DIAGNOSIS — R509 Fever, unspecified: Secondary | ICD-10-CM | POA: Diagnosis not present

## 2014-06-30 DIAGNOSIS — Z7983 Long term (current) use of bisphosphonates: Secondary | ICD-10-CM | POA: Diagnosis not present

## 2014-06-30 DIAGNOSIS — F0390 Unspecified dementia without behavioral disturbance: Secondary | ICD-10-CM | POA: Diagnosis present

## 2014-06-30 DIAGNOSIS — E780 Pure hypercholesterolemia: Secondary | ICD-10-CM | POA: Diagnosis present

## 2014-06-30 DIAGNOSIS — F339 Major depressive disorder, recurrent, unspecified: Secondary | ICD-10-CM | POA: Diagnosis present

## 2014-06-30 DIAGNOSIS — I482 Chronic atrial fibrillation: Secondary | ICD-10-CM | POA: Diagnosis present

## 2014-06-30 DIAGNOSIS — J189 Pneumonia, unspecified organism: Secondary | ICD-10-CM | POA: Diagnosis present

## 2014-06-30 DIAGNOSIS — I4891 Unspecified atrial fibrillation: Secondary | ICD-10-CM | POA: Diagnosis not present

## 2014-06-30 DIAGNOSIS — F250 Schizoaffective disorder, bipolar type: Secondary | ICD-10-CM | POA: Diagnosis present

## 2014-06-30 DIAGNOSIS — E039 Hypothyroidism, unspecified: Secondary | ICD-10-CM | POA: Diagnosis present

## 2014-06-30 DIAGNOSIS — Z7901 Long term (current) use of anticoagulants: Secondary | ICD-10-CM

## 2014-06-30 DIAGNOSIS — R52 Pain, unspecified: Secondary | ICD-10-CM | POA: Diagnosis present

## 2014-06-30 DIAGNOSIS — N183 Chronic kidney disease, stage 3 (moderate): Secondary | ICD-10-CM | POA: Diagnosis present

## 2014-06-30 DIAGNOSIS — R0602 Shortness of breath: Secondary | ICD-10-CM

## 2014-06-30 DIAGNOSIS — N3000 Acute cystitis without hematuria: Secondary | ICD-10-CM | POA: Diagnosis not present

## 2014-06-30 DIAGNOSIS — Z8673 Personal history of transient ischemic attack (TIA), and cerebral infarction without residual deficits: Secondary | ICD-10-CM | POA: Diagnosis not present

## 2014-06-30 DIAGNOSIS — G20 Parkinson's disease: Secondary | ICD-10-CM | POA: Diagnosis not present

## 2014-06-30 DIAGNOSIS — R531 Weakness: Secondary | ICD-10-CM | POA: Diagnosis not present

## 2014-06-30 DIAGNOSIS — K5730 Diverticulosis of large intestine without perforation or abscess without bleeding: Secondary | ICD-10-CM | POA: Diagnosis not present

## 2014-06-30 DIAGNOSIS — E119 Type 2 diabetes mellitus without complications: Secondary | ICD-10-CM | POA: Diagnosis not present

## 2014-06-30 DIAGNOSIS — K819 Cholecystitis, unspecified: Secondary | ICD-10-CM | POA: Diagnosis not present

## 2014-06-30 DIAGNOSIS — R112 Nausea with vomiting, unspecified: Secondary | ICD-10-CM | POA: Diagnosis not present

## 2014-06-30 DIAGNOSIS — J9811 Atelectasis: Secondary | ICD-10-CM | POA: Diagnosis present

## 2014-06-30 DIAGNOSIS — Z79899 Other long term (current) drug therapy: Secondary | ICD-10-CM

## 2014-06-30 DIAGNOSIS — G4700 Insomnia, unspecified: Secondary | ICD-10-CM | POA: Diagnosis present

## 2014-06-30 DIAGNOSIS — G252 Other specified forms of tremor: Secondary | ICD-10-CM | POA: Diagnosis present

## 2014-06-30 DIAGNOSIS — A419 Sepsis, unspecified organism: Secondary | ICD-10-CM | POA: Diagnosis not present

## 2014-06-30 DIAGNOSIS — F0280 Dementia in other diseases classified elsewhere without behavioral disturbance: Secondary | ICD-10-CM | POA: Diagnosis not present

## 2014-06-30 DIAGNOSIS — K8020 Calculus of gallbladder without cholecystitis without obstruction: Secondary | ICD-10-CM | POA: Diagnosis not present

## 2014-06-30 DIAGNOSIS — N289 Disorder of kidney and ureter, unspecified: Secondary | ICD-10-CM | POA: Diagnosis not present

## 2014-06-30 DIAGNOSIS — H409 Unspecified glaucoma: Secondary | ICD-10-CM | POA: Diagnosis not present

## 2014-06-30 DIAGNOSIS — K8010 Calculus of gallbladder with chronic cholecystitis without obstruction: Secondary | ICD-10-CM | POA: Diagnosis not present

## 2014-06-30 DIAGNOSIS — F39 Unspecified mood [affective] disorder: Secondary | ICD-10-CM | POA: Diagnosis not present

## 2014-06-30 DIAGNOSIS — I129 Hypertensive chronic kidney disease with stage 1 through stage 4 chronic kidney disease, or unspecified chronic kidney disease: Secondary | ICD-10-CM | POA: Diagnosis present

## 2014-06-30 DIAGNOSIS — R05 Cough: Secondary | ICD-10-CM | POA: Diagnosis not present

## 2014-06-30 DIAGNOSIS — N390 Urinary tract infection, site not specified: Secondary | ICD-10-CM

## 2014-06-30 DIAGNOSIS — K820 Obstruction of gallbladder: Secondary | ICD-10-CM | POA: Diagnosis not present

## 2014-06-30 DIAGNOSIS — F332 Major depressive disorder, recurrent severe without psychotic features: Secondary | ICD-10-CM | POA: Diagnosis not present

## 2014-06-30 DIAGNOSIS — R4182 Altered mental status, unspecified: Secondary | ICD-10-CM | POA: Diagnosis not present

## 2014-06-30 DIAGNOSIS — B9620 Unspecified Escherichia coli [E. coli] as the cause of diseases classified elsewhere: Secondary | ICD-10-CM | POA: Diagnosis present

## 2014-06-30 DIAGNOSIS — K810 Acute cholecystitis: Secondary | ICD-10-CM

## 2014-06-30 DIAGNOSIS — N3001 Acute cystitis with hematuria: Secondary | ICD-10-CM | POA: Diagnosis present

## 2014-06-30 DIAGNOSIS — Z23 Encounter for immunization: Secondary | ICD-10-CM | POA: Diagnosis not present

## 2014-06-30 DIAGNOSIS — I6789 Other cerebrovascular disease: Secondary | ICD-10-CM | POA: Diagnosis not present

## 2014-06-30 HISTORY — DX: Unspecified dementia, unspecified severity, without behavioral disturbance, psychotic disturbance, mood disturbance, and anxiety: F03.90

## 2014-06-30 HISTORY — DX: Unspecified mood (affective) disorder: F39

## 2014-06-30 HISTORY — DX: Major depressive disorder, recurrent, unspecified: F33.9

## 2014-06-30 HISTORY — DX: Essential (primary) hypertension: I10

## 2014-06-30 HISTORY — DX: Pure hypercholesterolemia, unspecified: E78.00

## 2014-06-30 HISTORY — DX: Schizoaffective disorder, bipolar type: F25.0

## 2014-06-30 HISTORY — DX: Disorder of kidney and ureter, unspecified: N28.9

## 2014-06-30 HISTORY — DX: Cerebral infarction, unspecified: I63.9

## 2014-06-30 HISTORY — DX: Unspecified atrial fibrillation: I48.91

## 2014-06-30 HISTORY — DX: Parkinson's disease without dyskinesia, without mention of fluctuations: G20.A1

## 2014-06-30 HISTORY — DX: Insomnia, unspecified: G47.00

## 2014-06-30 HISTORY — DX: Parkinson's disease: G20

## 2014-06-30 LAB — URINALYSIS COMPLETE WITH MICROSCOPIC (ARMC ONLY)
BILIRUBIN URINE: NEGATIVE
Glucose, UA: NEGATIVE mg/dL
KETONES UR: NEGATIVE mg/dL
NITRITE: NEGATIVE
PH: 6 (ref 5.0–8.0)
Protein, ur: 30 mg/dL — AB
Specific Gravity, Urine: 1.014 (ref 1.005–1.030)
Squamous Epithelial / LPF: NONE SEEN

## 2014-06-30 LAB — COMPREHENSIVE METABOLIC PANEL
ALBUMIN: 3.6 g/dL (ref 3.5–5.0)
ALT: 268 U/L — ABNORMAL HIGH (ref 14–54)
AST: 447 U/L — AB (ref 15–41)
Alkaline Phosphatase: 126 U/L (ref 38–126)
Anion gap: 13 (ref 5–15)
BUN: 19 mg/dL (ref 6–20)
CHLORIDE: 101 mmol/L (ref 101–111)
CO2: 26 mmol/L (ref 22–32)
CREATININE: 1.29 mg/dL — AB (ref 0.44–1.00)
Calcium: 9.3 mg/dL (ref 8.9–10.3)
GFR calc Af Amer: 43 mL/min — ABNORMAL LOW (ref >60)
GFR, EST NON AFRICAN AMERICAN: 37 mL/min — AB (ref >60)
Glucose, Bld: 161 mg/dL — ABNORMAL HIGH (ref 65–99)
POTASSIUM: 4.4 mmol/L (ref 3.5–5.1)
SODIUM: 140 mmol/L (ref 135–145)
Total Bilirubin: 3.1 mg/dL — ABNORMAL HIGH (ref 0.3–1.2)
Total Protein: 7.6 g/dL (ref 6.5–8.1)

## 2014-06-30 LAB — CBC WITH DIFFERENTIAL/PLATELET
BASOS ABS: 0 10*3/uL (ref 0–0.1)
Basophils Relative: 0 %
EOS ABS: 0 10*3/uL (ref 0–0.7)
EOS PCT: 0 %
HEMATOCRIT: 49.3 % — AB (ref 35.0–47.0)
HEMOGLOBIN: 16.2 g/dL — AB (ref 12.0–16.0)
LYMPHS ABS: 0.3 10*3/uL — AB (ref 1.0–3.6)
Lymphocytes Relative: 3 %
MCH: 32 pg (ref 26.0–34.0)
MCHC: 32.8 g/dL (ref 32.0–36.0)
MCV: 97.5 fL (ref 80.0–100.0)
MONOS PCT: 3 %
Monocytes Absolute: 0.4 10*3/uL (ref 0.2–0.9)
NEUTROS ABS: 11.7 10*3/uL — AB (ref 1.4–6.5)
Neutrophils Relative %: 94 %
PLATELETS: 176 10*3/uL (ref 150–440)
RBC: 5.05 MIL/uL (ref 3.80–5.20)
RDW: 12.8 % (ref 11.5–14.5)
WBC: 12.4 10*3/uL — AB (ref 3.6–11.0)

## 2014-06-30 LAB — TROPONIN I: Troponin I: 0.06 ng/mL — ABNORMAL HIGH (ref <0.031)

## 2014-06-30 LAB — BLOOD GAS, VENOUS
Acid-Base Excess: 0.5 mmol/L (ref 0.0–3.0)
Bicarbonate: 24.6 mEq/L (ref 21.0–28.0)
PATIENT TEMPERATURE: 37
pCO2, Ven: 37 mmHg — ABNORMAL LOW (ref 44.0–60.0)
pH, Ven: 7.43 (ref 7.320–7.430)

## 2014-06-30 LAB — LACTIC ACID, PLASMA
LACTIC ACID, VENOUS: 2.2 mmol/L — AB (ref 0.5–2.0)
Lactic Acid, Venous: 3.8 mmol/L (ref 0.5–2.0)

## 2014-06-30 LAB — TSH: TSH: 1.932 u[IU]/mL (ref 0.350–4.500)

## 2014-06-30 LAB — PROTIME-INR
INR: 1.25
Prothrombin Time: 15.9 seconds — ABNORMAL HIGH (ref 11.4–15.0)

## 2014-06-30 MED ORDER — AZITHROMYCIN 500 MG IV SOLR
INTRAVENOUS | Status: AC
  Administered 2014-06-30: 500 mg via INTRAVENOUS
  Filled 2014-06-30: qty 500

## 2014-06-30 MED ORDER — LITHIUM CARBONATE 150 MG PO CAPS
150.0000 mg | ORAL_CAPSULE | Freq: Every day | ORAL | Status: DC
  Filled 2014-06-30: qty 1

## 2014-06-30 MED ORDER — ALENDRONATE SODIUM 70 MG PO TABS
70.0000 mg | ORAL_TABLET | ORAL | Status: DC
Start: 2014-07-30 — End: 2014-06-30

## 2014-06-30 MED ORDER — TIMOLOL MALEATE 0.5 % OP SOLN
1.0000 [drp] | Freq: Every day | OPHTHALMIC | Status: DC
  Administered 2014-06-30 – 2014-07-05 (×6): 1 [drp] via OPHTHALMIC
  Filled 2014-06-30: qty 5

## 2014-06-30 MED ORDER — VANCOMYCIN HCL IN DEXTROSE 1-5 GM/200ML-% IV SOLN
1000.0000 mg | Freq: Once | INTRAVENOUS | Status: AC
  Administered 2014-06-30: 1000 mg via INTRAVENOUS

## 2014-06-30 MED ORDER — LATANOPROST 0.005 % OP SOLN
1.0000 [drp] | Freq: Every day | OPHTHALMIC | Status: DC
  Administered 2014-06-30 – 2014-07-04 (×5): 1 [drp] via OPHTHALMIC
  Filled 2014-06-30: qty 2.5

## 2014-06-30 MED ORDER — SODIUM CHLORIDE 0.9 % IV BOLUS (SEPSIS)
1000.0000 mL | Freq: Once | INTRAVENOUS | Status: AC
  Administered 2014-06-30: 1000 mL via INTRAVENOUS

## 2014-06-30 MED ORDER — RIVAROXABAN 15 MG PO TABS
15.0000 mg | ORAL_TABLET | Freq: Every day | ORAL | Status: DC
  Administered 2014-06-30 – 2014-07-03 (×4): 15 mg via ORAL
  Filled 2014-06-30 (×4): qty 1

## 2014-06-30 MED ORDER — CALCIUM CARBONATE-VITAMIN D 500-200 MG-UNIT PO TABS
1.0000 | ORAL_TABLET | Freq: Two times a day (BID) | ORAL | Status: DC
  Administered 2014-06-30 – 2014-07-05 (×11): 1 via ORAL
  Filled 2014-06-30 (×11): qty 1

## 2014-06-30 MED ORDER — POLYETHYLENE GLYCOL 3350 17 GM/SCOOP PO POWD
0.5000 | Freq: Every day | ORAL | Status: DC
  Filled 2014-06-30: qty 255

## 2014-06-30 MED ORDER — IPRATROPIUM-ALBUTEROL 0.5-2.5 (3) MG/3ML IN SOLN
3.0000 mL | Freq: Four times a day (QID) | RESPIRATORY_TRACT | Status: DC | PRN
  Administered 2014-06-30 – 2014-07-03 (×3): 3 mL via RESPIRATORY_TRACT
  Filled 2014-06-30 (×3): qty 3

## 2014-06-30 MED ORDER — METOPROLOL TARTRATE 25 MG PO TABS
25.0000 mg | ORAL_TABLET | Freq: Two times a day (BID) | ORAL | Status: DC
  Administered 2014-06-30 – 2014-07-05 (×10): 25 mg via ORAL
  Filled 2014-06-30 (×10): qty 1

## 2014-06-30 MED ORDER — LEVOTHYROXINE SODIUM 25 MCG PO TABS
25.0000 ug | ORAL_TABLET | Freq: Every day | ORAL | Status: DC
  Administered 2014-07-01 – 2014-07-05 (×5): 25 ug via ORAL
  Filled 2014-06-30 (×5): qty 1

## 2014-06-30 MED ORDER — NYSTATIN 100000 UNIT/GM EX POWD
Freq: Three times a day (TID) | CUTANEOUS | Status: DC
  Administered 2014-06-30 – 2014-07-05 (×15): via TOPICAL
  Filled 2014-06-30: qty 15

## 2014-06-30 MED ORDER — ALUM & MAG HYDROXIDE-SIMETH 200-200-20 MG/5ML PO SUSP: 30.0000 mL | Freq: Four times a day (QID) | ORAL | Status: DC | PRN

## 2014-06-30 MED ORDER — ENALAPRIL MALEATE 5 MG PO TABS: 5.0000 mg | ORAL_TABLET | Freq: Every day | ORAL | Status: DC

## 2014-06-30 MED ORDER — BACID PO TABS
2.0000 | ORAL_TABLET | Freq: Every day | ORAL | Status: DC
  Filled 2014-06-30: qty 2

## 2014-06-30 MED ORDER — ZINC OXIDE 40 % EX OINT
TOPICAL_OINTMENT | Freq: Three times a day (TID) | CUTANEOUS | Status: DC
  Administered 2014-06-30 – 2014-07-05 (×12): via TOPICAL
  Filled 2014-06-30: qty 114

## 2014-06-30 MED ORDER — SODIUM CHLORIDE 0.9 % IJ SOLN: 3.0000 mL | Freq: Two times a day (BID) | INTRAMUSCULAR | Status: DC

## 2014-06-30 MED ORDER — SODIUM CHLORIDE 0.9 % IJ SOLN: 3.0000 mL | INTRAMUSCULAR | Status: DC | PRN

## 2014-06-30 MED ORDER — PRAVASTATIN SODIUM 20 MG PO TABS
20.0000 mg | ORAL_TABLET | Freq: Every day | ORAL | Status: DC
  Administered 2014-07-01 – 2014-07-05 (×5): 20 mg via ORAL
  Filled 2014-06-30 (×5): qty 1

## 2014-06-30 MED ORDER — BRIMONIDINE TARTRATE 0.2 % OP SOLN
1.0000 [drp] | Freq: Every day | OPHTHALMIC | Status: DC
  Administered 2014-06-30 – 2014-07-05 (×6): 1 [drp] via OPHTHALMIC
  Filled 2014-06-30: qty 5

## 2014-06-30 MED ORDER — HYDROCORTISONE 1 % EX OINT
TOPICAL_OINTMENT | Freq: Two times a day (BID) | CUTANEOUS | Status: DC
  Administered 2014-06-30 – 2014-07-04 (×7): via TOPICAL
  Filled 2014-06-30: qty 28.35

## 2014-06-30 MED ORDER — ONDANSETRON HCL 4 MG/2ML IJ SOLN
4.0000 mg | Freq: Four times a day (QID) | INTRAMUSCULAR | Status: DC | PRN
  Administered 2014-07-01 – 2014-07-03 (×4): 4 mg via INTRAVENOUS
  Filled 2014-06-30 (×4): qty 2

## 2014-06-30 MED ORDER — SODIUM CHLORIDE 0.9 % IV BOLUS (SEPSIS)
1000.0000 mL | INTRAVENOUS | Status: DC
  Administered 2014-06-30: 4000 mL via INTRAVENOUS

## 2014-06-30 MED ORDER — CEFTRIAXONE SODIUM IN DEXTROSE 20 MG/ML IV SOLN
1.0000 g | Freq: Once | INTRAVENOUS | Status: AC
  Administered 2014-06-30: 1 g via INTRAVENOUS

## 2014-06-30 MED ORDER — CEFTRIAXONE SODIUM IN DEXTROSE 20 MG/ML IV SOLN
INTRAVENOUS | Status: AC
  Administered 2014-06-30: 1 g via INTRAVENOUS
  Filled 2014-06-30: qty 50

## 2014-06-30 MED ORDER — LITHIUM CITRATE 300 MG/5 ML PO SYRP
150.0000 mg | Freq: Every day | ORAL | Status: DC
  Administered 2014-06-30 – 2014-07-05 (×6): 150 mg via ORAL
  Filled 2014-06-30 (×6): qty 2.5

## 2014-06-30 MED ORDER — ONDANSETRON HCL 4 MG PO TABS: 4.0000 mg | ORAL_TABLET | Freq: Four times a day (QID) | ORAL | Status: DC | PRN

## 2014-06-30 MED ORDER — VANCOMYCIN HCL IN DEXTROSE 1-5 GM/200ML-% IV SOLN
INTRAVENOUS | Status: AC
  Filled 2014-06-30: qty 200

## 2014-06-30 MED ORDER — DEXTROSE 5 % IV SOLN
500.0000 mg | Freq: Once | INTRAVENOUS | Status: AC
  Administered 2014-06-30: 500 mg via INTRAVENOUS

## 2014-06-30 MED ORDER — SODIUM CHLORIDE 0.9 % IV SOLN
1.0000 g | INTRAVENOUS | Status: DC
  Administered 2014-06-30 – 2014-07-02 (×3): 1 g via INTRAVENOUS
  Filled 2014-06-30 (×5): qty 1

## 2014-06-30 MED ORDER — ACETAMINOPHEN 650 MG RE SUPP
RECTAL | Status: AC
  Filled 2014-06-30: qty 2

## 2014-06-30 MED ORDER — METOPROLOL TARTRATE 50 MG PO TABS: 50.0000 mg | ORAL_TABLET | Freq: Two times a day (BID) | ORAL | Status: DC

## 2014-06-30 MED ORDER — BRIMONIDINE TARTRATE-TIMOLOL 0.2-0.5 % OP SOLN: 1.0000 [drp] | Freq: Every day | OPHTHALMIC | Status: DC

## 2014-06-30 MED ORDER — FUROSEMIDE 20 MG PO TABS
20.0000 mg | ORAL_TABLET | Freq: Every day | ORAL | Status: DC
  Administered 2014-07-01 – 2014-07-05 (×5): 20 mg via ORAL
  Filled 2014-06-30 (×5): qty 1

## 2014-06-30 MED ORDER — ACETAMINOPHEN 325 MG RE SUPP
975.0000 mg | Freq: Once | RECTAL | Status: AC
  Administered 2014-06-30: 975 mg via RECTAL

## 2014-06-30 MED ORDER — OMEGA-3-ACID ETHYL ESTERS 1 G PO CAPS
2.0000 | ORAL_CAPSULE | Freq: Two times a day (BID) | ORAL | Status: DC
  Administered 2014-06-30 – 2014-07-05 (×10): 2 g via ORAL
  Filled 2014-06-30 (×10): qty 2

## 2014-06-30 MED ORDER — ACETAMINOPHEN 650 MG RE SUPP: 650.0000 mg | Freq: Four times a day (QID) | RECTAL | Status: DC | PRN

## 2014-06-30 MED ORDER — POLYETHYLENE GLYCOL 3350 17 G PO PACK
17.0000 g | PACK | Freq: Every day | ORAL | Status: DC
  Administered 2014-07-02 – 2014-07-03 (×2): 17 g via ORAL
  Filled 2014-06-30 (×3): qty 1

## 2014-06-30 MED ORDER — ACETAMINOPHEN 325 MG PO TABS
650.0000 mg | ORAL_TABLET | Freq: Four times a day (QID) | ORAL | Status: DC | PRN
  Administered 2014-06-30 – 2014-07-03 (×2): 650 mg via ORAL
  Filled 2014-06-30 (×2): qty 2

## 2014-06-30 MED ORDER — RISAQUAD PO CAPS
2.0000 | ORAL_CAPSULE | Freq: Every day | ORAL | Status: DC
  Administered 2014-07-01 – 2014-07-05 (×5): 2 via ORAL
  Filled 2014-06-30 (×6): qty 2

## 2014-06-30 MED ORDER — ARIPIPRAZOLE 2 MG PO TABS
2.0000 mg | ORAL_TABLET | Freq: Every day | ORAL | Status: DC
  Administered 2014-06-30 – 2014-07-05 (×6): 2 mg via ORAL
  Filled 2014-06-30 (×7): qty 1

## 2014-06-30 MED ORDER — ADULT MULTIVITAMIN W/MINERALS CH
1.0000 | ORAL_TABLET | Freq: Every day | ORAL | Status: DC
  Administered 2014-07-01 – 2014-07-05 (×5): 1 via ORAL
  Filled 2014-06-30 (×10): qty 1

## 2014-06-30 NOTE — ED Notes (Signed)
Pt present to ED via EMS from [facility] for N/V all day, AMS at 3am, fever at 200am 101.6 given tylenol at the nursing home, nursing home given mylanta at 0132. Pt uses bipap at night. Hx of afib. Pt is pale, afib avr, elevated HR and resp, Moaning, fever, ams. Hands tremors and eyes roll in the back of head but pt follows commands. Pt alert to self only.

## 2014-06-30 NOTE — Progress Notes (Signed)
[facility] [place] at [facility]   PATIENT NAME: Kirsten Tran    MR#:  [id number]  DATE OF BIRTH:  08/14/1930  SUBJECTIVE:  CHIEF COMPLAINT:   Chief Complaint  Patient presents with  . Code Sepsis    ams vomiting afib rvr fever tachyapnea   -Admitted with fever and confusion.Noted to be septic. - acute cystitis and also cholecystitis - denies any nausea, vomiting or abdominal pain now - requesting to eat something  REVIEW OF SYSTEMS:  Review of Systems  Constitutional: Positive for fever and chills.  Respiratory: Negative for cough, shortness of breath and wheezing.   Cardiovascular: Negative for chest pain and palpitations.  Gastrointestinal: Positive for nausea, vomiting and abdominal pain. Negative for diarrhea and constipation.  Genitourinary: Negative for dysuria.  Neurological: Negative for dizziness, seizures and headaches.    DRUG ALLERGIES:  No Known Allergies  VITALS:  Blood pressure 105/45, pulse 98, temperature 98.8 F (37.1 C), temperature source Other (Comment), resp. rate 17, height  (1.651 m), weight 121.02 kg (266 lb 12.8 oz), SpO2 100 %.  PHYSICAL EXAMINATION:  Physical Exam  GENERAL:  81 y.o.-year-old patient lying in the bed with no acute distress.  EYES: Pupils equal, round, reactive to light and accommodation. No scleral icterus. Extraocular muscles intact.  HEENT: Head atraumatic, normocephalic. Oropharynx and nasopharynx clear.  NECK:  Supple, no jugular venous distention. No thyroid enlargement, no tenderness.  LUNGS: Normal breath sounds bilaterally, decreased at the bases, no wheezing, rales,rhonchi or crepitation. No use of accessory muscles of respiration. Appears dyspneic. CARDIOVASCULAR: S1, S2 normal. No murmurs, rubs, or gallops.  ABDOMEN: Soft, nontender, nondistended. Bowel sounds present. No organomegaly or mass.  No right upper quadrant tenderness. EXTREMITIES: No pedal edema, cyanosis, or  clubbing.  NEUROLOGIC: Cranial nerves II through XII are intact. Muscle strength 5/5 in all extremities. Sensation intact. Gait not checked.  PSYCHIATRIC: The patient is alert and oriented x 2-3.  SKIN: No obvious rash, lesion, or ulcer.    LABORATORY PANEL:   CBC  Recent Labs Lab 06/30/14 0335  WBC 12.4*  HGB 16.2*  HCT 49.3*  PLT 176   ------------------------------------------------------------------------------------------------------------------  Chemistries   Recent Labs Lab 06/30/14 0335  NA 140  K 4.4  CL 101  CO2 26  GLUCOSE 161*  BUN 19  CREATININE 1.29*  CALCIUM 9.3  AST 447*  ALT 268*  ALKPHOS 126  BILITOT 3.1*   ------------------------------------------------------------------------------------------------------------------  Cardiac Enzymes  Recent Labs Lab 06/30/14 0335  TROPONINI 0.06*   ------------------------------------------------------------------------------------------------------------------  RADIOLOGY:  Dg Chest Port 1 View  06/30/2014   CLINICAL DATA:  Code sepsis. Altered mental status. Vomiting. Tachypnea, atrial fibrillation.  EXAM: PORTABLE CHEST - 1 VIEW  COMPARISON:  Frontal and lateral views 02/16/2007  FINDINGS: The heart is enlarged. There is vascular congestion. Bibasilar atelectasis, right greater than left. No frank pulmonary edema. No large pleural effusion. No pneumothorax. Calcified granuloma again seen in the left upper lung.  IMPRESSION: Cardiomegaly and vascular congestion.  Bibasilar atelectasis.   Electronically Signed   By: Rubye Oaks M.D.   On: 06/30/2014 03:59   US Abdomen Limited Ruq  06/30/2014   CLINICAL DATA:  Abdominal pain and sepsis  EXAM: US ABDOMEN LIMITED - RIGHT UPPER QUADRANT  COMPARISON:  None.  FINDINGS: Gallbladder:  Within the gallbladder, there are multiple echogenic foci which shadow but do not move, felt to represent adherent gallstones. The largest gallstone measures 1.6 cm in length. There  is mild gallbladder  wall thickening with pericholecystic fluid. Patient is tender over the gallbladder.  Common bile duct:  Diameter: 5 mm. There is no intrahepatic or extrahepatic biliary duct dilatation.  Liver:  No focal lesion identified. Liver echogenicity is diffusely increased.  IMPRESSION: The appearance of the gallbladder is concerning for acute cholecystitis.  Liver echogenicity is diffusely increased, most likely due to hepatic steatosis. While no focal liver lesions are identified, it must be cautioned that the sensitivity of ultrasound for focal liver lesions is diminished in this circumstance.  These results will be called to the ordering clinician or representative by the Radiologist Assistant, and communication documented in the PACS or zVision Dashboard.   Electronically Signed   By: Bretta BangWilliam  Woodruff III M.D.   On: 06/30/2014 07:30    EKG:  No orders found for this or any previous visit.  ASSESSMENT AND PLAN:   81y/oM with PMH of Dementia, Parkinsons disease, HTN, Afib, CVA, CKD, Depression, schizoaffective disorder admitted for sepsis  * Sepsis- likely source UTI and also cholecystitis - Blood and urine cultures are sent and pending now - will start empiric invanz for UTI and intra abd infection with sepsis - BP stable for now - IV fluids  * Acute cholecystitis-incidental finding on US abd - pt denies any RUQ pain, nausea or vomiting today- though she had nausea, vomiting yesterday - appreciate surgical consult - cont clear liquid diet for today  * Afib- rate controlled, on metoprolol On xarelto. Cont anticoagulation - as h/o CVA- will need bridging if needs surgery Stable for now  * HTN-monitor BP, with sepsis Currently on metoprolol, enalapril and lasix- decrease metoprolol dose ad discontinue enalapril continue lasix  As CXR with vascular congestion  * Schizoaffective disorder- on lithium and abilify, stable  * CKD-monitor, while on lasix and esp with  sepsis. Will hold off fluids today as CXR with congestion and some dyspnea on exam  * DVT prophylaxis- on xarelto   Physical therapy consult.  All the records are reviewed and case discussed with Care Management/Social Workerr. Management plans discussed with the patient, family and they are in agreement.  CODE STATUS: Full code  TOTAL TIME TAKING CARE OF THIS PATIENT: 38 minutes.   POSSIBLE D/C IN 2 DAYS, DEPENDING ON CLINICAL CONDITION.   Enid BaasKALISETTI,Yasmeen Manka M.D on 06/30/2014 at 2:22 PM  Between 7am to 6pm - Pager - [phone]  After 6pm go to [email or web address] - password [email or web address] [facility]  [facility]  Office  [phone]  CC: Primary care physician; No primary care provider on file.

## 2014-06-30 NOTE — H&P (Signed)
Kirsten Tran is an 80 y.o. female.   Chief Complaint: Altered mental status HPI: The patient presents emergency department via EMS from her nursing home where she was apparently vomiting and febrile. She was given Tylenol and Maalox prior to arrival. In the emergency department the patient was found to have A. fib with intermittent rapid ventricular response. She was found to have a UTI and sepsis which prompted the emergency department to call for admission.  Past Medical History  Diagnosis Date  . Dementia   . Parkinson disease   . Essential hypertension   . Atrial fibrillation, unspecified   . Stroke   . Renal disorder   . Major depression, recurrent, chronic   . Schizoaffective disorder, bipolar type   . Hypercholesteremia   . Mood disorder   . Insomnia     No past surgical history on file. patient cannot contribute to her surgical history due to decreased level of alertness and likely dementia  No family history on file. Social History:  has no tobacco, alcohol, and drug history on file.  Allergies: No Known Allergies  Prior to Admission medications   Medication Sig Start Date End Date Taking? Authorizing Provider  alendronate (FOSAMAX) 70 MG tablet Take 1 tablet by mouth every 30 (thirty) days. 02/09/06  Yes Historical Provider, MD  alum & mag hydroxide-simeth (MAALOX/MYLANTA) 200-200-20 MG/5ML suspension Take 30 mLs by mouth every 6 (six) hours as needed for indigestion or heartburn.   Yes Historical Provider, MD  desonide (DESOWEN) 0.05 % lotion Apply topically. 02/24/11  Yes Historical Provider, MD  latanoprost (XALATAN) 0.005 % ophthalmic solution Apply 1 drop to eye at bedtime. 02/18/10  Yes Historical Provider, MD  Multiple Vitamins-Minerals (MULTIVITAMIN WITH MINERALS) tablet Take 1 tablet by mouth daily. 02/18/10  Yes Historical Provider, MD  polyethylene glycol powder (GLYCOLAX/MIRALAX) powder Take 1 Dose by mouth daily. 02/18/10  Yes Historical Provider, MD   ARIPiprazole (ABILIFY) 2 MG tablet Take 1 tablet by mouth daily. 05/10/14   Historical Provider, MD  Calcium-Vitamin D 600-200 MG-UNIT per tablet Take 1 tablet by mouth 2 (two) times daily.    Historical Provider, MD  COMBIGAN 0.2-0.5 % ophthalmic solution Place 1 drop into both eyes daily. 06/27/14   Historical Provider, MD  Emollient (CERAVE) CREA Apply 1 application topically daily.    Historical Provider, MD  enalapril (VASOTEC) 5 MG tablet Take 1 tablet by mouth daily. 06/29/14   Historical Provider, MD  furosemide (LASIX) 20 MG tablet Take 1 tablet by mouth daily. 06/19/14   Historical Provider, MD  ipratropium-albuterol (DUONEB) 0.5-2.5 (3) MG/3ML SOLN Inhale 3 mLs into the lungs every 6 (six) hours as needed. 03/26/14   Historical Provider, MD  Lactobacillus (ACIDOPHILUS) CAPS capsule Take 2 capsules by mouth daily.    Historical Provider, MD  levothyroxine (SYNTHROID, LEVOTHROID) 25 MCG tablet Take 1 tablet by mouth daily. 06/24/14   Historical Provider, MD  lithium carbonate 150 MG capsule Take 1 capsule by mouth at bedtime. 06/01/14   Historical Provider, MD  metoprolol (LOPRESSOR) 50 MG tablet Take 1 tablet by mouth 2 (two) times daily. 06/10/14   Historical Provider, MD  omega-3 acid ethyl esters (LOVAZA) 1 G capsule Take 2 capsules by mouth 2 (two) times daily. 06/27/14   Historical Provider, MD  pravastatin (PRAVACHOL) 20 MG tablet Take 1 tablet by mouth daily. 06/18/14   Historical Provider, MD  XARELTO 15 MG TABS tablet Take 1 tablet by mouth daily. 06/04/14   Historical Provider, MD  Results for orders placed or performed during the hospital encounter of 06/30/14 (from the past 48 hour(s))  CBC with Differential     Status: Abnormal   Collection Time: 06/30/14  3:35 AM  Result Value Ref Range   WBC 12.4 (H) 3.6 - 11.0 K/uL   RBC 5.05 3.80 - 5.20 MIL/uL   Hemoglobin 16.2 (H) 12.0 - 16.0 g/dL   HCT 49.3 (H) 35.0 - 47.0 %   MCV 97.5 80.0 - 100.0 fL   MCH 32.0 26.0 - 34.0 pg   MCHC  32.8 32.0 - 36.0 g/dL   RDW 12.8 11.5 - 14.5 %   Platelets 176 150 - 440 K/uL   Neutrophils Relative % 94 %   Neutro Abs 11.7 (H) 1.4 - 6.5 K/uL   Lymphocytes Relative 3 %   Lymphs Abs 0.3 (L) 1.0 - 3.6 K/uL   Monocytes Relative 3 %   Monocytes Absolute 0.4 0.2 - 0.9 K/uL   Eosinophils Relative 0 %   Eosinophils Absolute 0.0 0 - 0.7 K/uL   Basophils Relative 0 %   Basophils Absolute 0.0 0 - 0.1 K/uL  Comprehensive metabolic panel     Status: Abnormal   Collection Time: 06/30/14  3:35 AM  Result Value Ref Range   Sodium 140 135 - 145 mmol/L   Potassium 4.4 3.5 - 5.1 mmol/L   Chloride 101 101 - 111 mmol/L   CO2 26 22 - 32 mmol/L   Glucose, Bld 161 (H) 65 - 99 mg/dL   BUN 19 6 - 20 mg/dL   Creatinine, Ser 1.29 (H) 0.44 - 1.00 mg/dL   Calcium 9.3 8.9 - 10.3 mg/dL   Total Protein 7.6 6.5 - 8.1 g/dL   Albumin 3.6 3.5 - 5.0 g/dL   AST 447 (H) 15 - 41 U/L   ALT 268 (H) 14 - 54 U/L   Alkaline Phosphatase 126 38 - 126 U/L   Total Bilirubin 3.1 (H) 0.3 - 1.2 mg/dL   GFR calc non Af Amer 37 (L) >60 mL/min   GFR calc Af Amer 43 (L) >60 mL/min    Comment: (NOTE) The eGFR has been calculated using the CKD EPI equation. This calculation has not been validated in all clinical situations. eGFR's persistently <60 mL/min signify possible Chronic Kidney Disease.    Anion gap 13 5 - 15  Troponin I     Status: Abnormal   Collection Time: 06/30/14  3:35 AM  Result Value Ref Range   Troponin I 0.06 (H) <0.031 ng/mL    Comment: READ BACK AND VERIFIED WITH KIMREY BROWN @ 5465 6.3.16 MPG        PERSISTENTLY INCREASED TROPONIN VALUES IN THE RANGE OF 0.04-0.49 ng/mL CAN BE SEEN IN:       -UNSTABLE ANGINA       -CONGESTIVE HEART FAILURE       -MYOCARDITIS       -CHEST TRAUMA       -ARRYHTHMIAS       -LATE PRESENTING MYOCARDIAL INFARCTION       -COPD   CLINICAL FOLLOW-UP RECOMMENDED.   Protime-INR     Status: Abnormal   Collection Time: 06/30/14  3:35 AM  Result Value Ref Range    Prothrombin Time 15.9 (H) 11.4 - 15.0 seconds   INR 1.25   Lactic acid, plasma     Status: Abnormal   Collection Time: 06/30/14  3:37 AM  Result Value Ref Range   Lactic Acid, Venous 3.8 (HH) 0.5 -  2.0 mmol/L    Comment: CRITICAL RESULT CALLED TO, READ BACK BY AND VERIFIED WITH KIMREY BROWN $RemoveBefo'@[id number]'[id number]$  06/30/14 BY AJO   Blood gas, venous     Status: Abnormal (Preliminary result)   Collection Time: 06/30/14  4:00 AM  Result Value Ref Range   pH, Ven 7.43 7.320 - 7.430   pCO2, Ven 37 (L) 44.0 - 60.0 mmHg   Bicarbonate 24.6 21.0 - 28.0 mEq/L   Acid-Base Excess 0.5 0.0 - 3.0 mmol/L   Patient temperature 37.0    Collection site VEIN    Sample type VEIN    Mechanical Rate PENDING   Urinalysis complete, with microscopic ([facility] only)     Status: Abnormal   Collection Time: 06/30/14  4:19 AM  Result Value Ref Range   Color, Urine AMBER (A) YELLOW   APPearance HAZY (A) CLEAR   Glucose, UA NEGATIVE NEGATIVE mg/dL   Bilirubin Urine NEGATIVE NEGATIVE   Ketones, ur NEGATIVE NEGATIVE mg/dL   Specific Gravity, Urine 1.014 1.005 - 1.030   Hgb urine dipstick 2+ (A) NEGATIVE   pH 6.0 5.0 - 8.0   Protein, ur 30 (A) NEGATIVE mg/dL   Nitrite NEGATIVE NEGATIVE   Leukocytes, UA TRACE (A) NEGATIVE   RBC / HPF 6-30 0 - 5 RBC/hpf   WBC, UA TOO NUMEROUS TO COUNT 0 - 5 WBC/hpf   Bacteria, UA MANY (A) NONE SEEN   Squamous Epithelial / LPF NONE SEEN NONE SEEN   Mucous PRESENT    Dg Chest Port 1 View  06/30/2014   CLINICAL DATA:  Code sepsis. Altered mental status. Vomiting. Tachypnea, atrial fibrillation.  EXAM: PORTABLE CHEST - 1 VIEW  COMPARISON:  Frontal and lateral views 02/16/2007  FINDINGS: The heart is enlarged. There is vascular congestion. Bibasilar atelectasis, right greater than left. No frank pulmonary edema. No large pleural effusion. No pneumothorax. Calcified granuloma again seen in the left upper lung.  IMPRESSION: Cardiomegaly and vascular congestion.  Bibasilar atelectasis.   Electronically Signed    By: Jeb Levering M.D.   On: 06/30/2014 03:59    Review of Systems  Constitutional: Negative for fever and chills.  HENT: Negative for sore throat and tinnitus.   Eyes: Negative for blurred vision and redness.  Respiratory: Negative for cough and shortness of breath.   Cardiovascular: Negative for chest pain, palpitations, orthopnea and PND.  Gastrointestinal: Negative for nausea, vomiting, abdominal pain and diarrhea.  Genitourinary: Negative for dysuria, urgency and frequency.  Musculoskeletal: Negative for myalgias and joint pain.  Skin: Negative for rash.       No lesions  Neurological: Negative for speech change, focal weakness and weakness.  Endo/Heme/Allergies: Does not bruise/bleed easily.       No temperature intolerance  Psychiatric/Behavioral: Negative for depression and suicidal ideas.    Blood pressure 103/59, pulse 106, temperature 100.5 F (38.1 C), temperature source Other (Comment), resp. rate 19, height $RemoveBe'5\' 5"'NjOfefLxG$  (1.651 m), weight 121.6 kg (268 lb 1.3 oz), SpO2 97 %. Physical Exam  Vitals reviewed. Constitutional: She is oriented to person, place, and time. She appears well-developed and well-nourished.  HENT:  Head: Normocephalic and atraumatic.  Eyes: EOM are normal. Pupils are equal, round, and reactive to light.  Neck: Normal range of motion. No tracheal deviation present. No thyromegaly present.  Cardiovascular: Normal rate, regular rhythm and normal heart sounds.  Exam reveals no gallop and no friction rub.   No murmur heard. Respiratory: Effort normal and breath sounds normal.  GI: Soft. Bowel sounds are  normal. She exhibits no distension. There is no tenderness.  Lymphadenopathy:    She has no cervical adenopathy.  Neurological: She is alert and oriented to person, place, and time. No cranial nerve deficit. She exhibits normal muscle tone.  Skin: Skin is warm and dry.  Hirsute; multiple skin tags  Psychiatric: She has a normal mood and affect. Her  speech is normal. Judgment and thought content normal.     Assessment/Plan This is an 81 year old female admitted for sepsis secondary to UTI found on admission. 1. Urinary tract infection: The patient has dementia and likely incontinence. Nursing reports indicate macerated skin of the perineum and intertriginous areas of the groin. The patient has been covered with systemic antibiotics after blood cultures were obtained and I will treat presumed fungal infection of her perineum with nystatin. I've also ordered Desitin barrier cream to promote healing of the skin in affected areas. 2. Sepsis the patient meets criteria via fever, leukocytosis, and tachycardia. She is currently hemodynamically stable. Follow blood and urine cultures and adjust antibiotics accordingly. 3. Atrial fibrillation: Chronic. The patient is intermittently and rapid ventricular rate. Monitor telemetry. Continue metoprolol and Xarelto. 4. Hypertension: Continue enalapril 5. Hypothyroidism: Continue Synthroid 6. Parkinson's disease: The patient has been seen to have a pill-rolling tremor of her hands. 7. DVT prophylaxis: As above 8. GI prophylaxis: None The patient is a full code. Time spent on admission orders and patient care approximately 35 minutes.  Harrie Foreman 06/30/2014, 6:19 AM

## 2014-06-30 NOTE — ED Notes (Signed)
All abt completed and all fluids ns in at this time.

## 2014-06-30 NOTE — Consult Note (Signed)
Patient ID: Kirsten Tran, female   DOB: 07/10/1930, 81 y.o.   MRN: [id number]  HPI Kirsten Tran is a 81 y.o. female seen in the emergency room for evaluation of confusion nausea vomiting fever on referral from her nursing home. She was admitted by the internal medicine service for SIRS and surgical service was consulted with regard to an ultrasound which demonstrated the presence of gallstones and possible gallbladder wall thickening. She is slightly elevated liver function studies consistent with possible biliary tract disease was also noted to be in atrial fibrillation with rapid ventricular response and have an elevated lactic acid. She has history of long-standing atrial fibrillation Parkinson's disease and multiple urinary tract infections. She's had a previous strokes be care for [facility] and that information was reviewed through the care everywhere option. Surgical service is asked to consider possible surgical intervention. She is extremely lethargic and unable to provide any other history.  HPI  Past Medical History  Diagnosis Date  . Dementia   . Parkinson disease   . Essential hypertension   . Atrial fibrillation, unspecified   . Stroke   . Renal disorder   . Major depression, recurrent, chronic   . Schizoaffective disorder, bipolar type   . Hypercholesteremia   . Mood disorder   . Insomnia     History reviewed. No pertinent past surgical history.  History reviewed. No pertinent family history.  Social History History  Substance Use Topics  . Smoking status: Unknown If Ever Smoked  . Smokeless tobacco: Not on file  . Alcohol Use: Not on file    No Known Allergies  Current Facility-Administered Medications  Medication Dose Route Frequency Provider Last Rate Last Dose  . acetaminophen (TYLENOL) 650 MG suppository           . vancomycin (VANCOCIN) 1 GM/200ML IVPB            Current Outpatient Prescriptions  Medication Sig Dispense Refill  .  alendronate (FOSAMAX) 70 MG tablet Take 1 tablet by mouth every 30 (thirty) days.    Marland Kitchen alum & mag hydroxide-simeth (MAALOX/MYLANTA) 200-200-20 MG/5ML suspension Take 30 mLs by mouth every 6 (six) hours as needed for indigestion or heartburn.    . desonide (DESOWEN) 0.05 % lotion Apply topically.    . latanoprost (XALATAN) 0.005 % ophthalmic solution Apply 1 drop to eye at bedtime.    . Multiple Vitamins-Minerals (MULTIVITAMIN WITH MINERALS) tablet Take 1 tablet by mouth daily.    . polyethylene glycol powder (GLYCOLAX/MIRALAX) powder Take 1 Dose by mouth daily.    . ARIPiprazole (ABILIFY) 2 MG tablet Take 1 tablet by mouth daily.    . Calcium-Vitamin D 600-200 MG-UNIT per tablet Take 1 tablet by mouth 2 (two) times daily.    . COMBIGAN 0.2-0.5 % ophthalmic solution Place 1 drop into both eyes daily.    . Emollient (CERAVE) CREA Apply 1 application topically daily.    . enalapril (VASOTEC) 5 MG tablet Take 1 tablet by mouth daily.    . furosemide (LASIX) 20 MG tablet Take 1 tablet by mouth daily.    Marland Kitchen ipratropium-albuterol (DUONEB) 0.5-2.5 (3) MG/3ML SOLN Inhale 3 mLs into the lungs every 6 (six) hours as needed.    . Lactobacillus (ACIDOPHILUS) CAPS capsule Take 2 capsules by mouth daily.    Marland Kitchen levothyroxine (SYNTHROID, LEVOTHROID) 25 MCG tablet Take 1 tablet by mouth daily.    Marland Kitchen lithium carbonate 150 MG capsule Take 1 capsule by mouth at bedtime.    Marland Kitchen  metoprolol (LOPRESSOR) 50 MG tablet Take 1 tablet by mouth 2 (two) times daily.    Marland Kitchen. omega-3 acid ethyl esters (LOVAZA) 1 G capsule Take 2 capsules by mouth 2 (two) times daily.    . pravastatin (PRAVACHOL) 20 MG tablet Take 1 tablet by mouth daily.    [name]. XARELTO 15 MG TABS tablet Take 1 tablet by mouth daily.       Review of Systems A 10 point review of systems was asked and was negative except for the following positive findings: Noted above. She is unable to contribute in any other way to the review of systems.  Physical Exam Blood pressure  110/57, pulse 139, temperature 100.1 F (37.8 C), temperature source Other (Comment), resp. rate 29, height 5\' 5"  (1.651 m), weight 121.6 kg (268 lb 1.3 oz), SpO2 97 %. CONSTITUTIONAL: She is markedly lethargic poorly responsive minimally verbal.. EYES: Pupils are equal, round, and reactive to light, Sclera are non-icteric. She is significantly hirsute and edentulous. EARS, NOSE, MOUTH AND THROAT: The oropharynx is clear. The oral mucosa is pink and moist. Hearing is intact to voice. LYMPH NODES:  Lymph nodes in the neck are normal. RESPIRATORY:  Lungs are clear. There is normal respiratory effort, with equal breath sounds bilaterally, and without pathologic use of accessory muscles. CARDIOVASCULAR: Heart is irregular without murmurs, gallops, or rubs. GI: The abdomen is  soft, nontender, and nondistended. There are no palpable masses. There is no hepatosplenomegaly. There are normal bowel sounds in all quadrants. She does appear to have minimal discomfort on deep tenderness in right upper quadrant GU: Rectal deferred.   MUSCULOSKELETAL: Normal muscle strength and tone. No cyanosis or edema.   SKIN: Turgor is good and there are no pathologic skin lesions or ulcers. NEUROLOGIC: Motor and sensation is grossly normal. Cranial nerves are grossly intact. PSYCH:  Impossible to determine due to her dementia and confusion.  Data Reviewed I reviewed her ultrasound and laboratory values. I have personally reviewed the patient's imaging, laboratory findings and medical records.    Assessment    She is clearly septic etiology unclear. With this presentation biliary tract could be an option for the source of this problem. However, she has multiple other medical problems including her anticoagulation which would defer surgical intervention. At some point abdominal CT scan may be of benefit to be certain there is not another potential hidden abdominal problem as she is mildly lactic acidotic. If we can bridge  her anticoagulation or deferred for a while she could be a candidate for percutaneous abdominal drainage of her gallbladder. At the present time she is not a surgical candidate. We will continue to follow her while she is hospitalized.    Plan         Time spent with the patient was 30 minutes, with more than 50% of the time spent in face-to-face education, counseling and care coordination.     Tiney RougeRalph Ely III 06/30/2014, 8:46 AM

## 2014-06-30 NOTE — ED Notes (Signed)
MD notified of troponin 0.06

## 2014-06-30 NOTE — ED Provider Notes (Signed)
[facility] Emergency Department Provider Note  ____________________________________________  Time seen: Approximately 3:25 AM  I have reviewed the triage vital signs and the nursing notes.   HISTORY  Chief Complaint Code Sepsis  History limited by dementia and decreased level of consciousness   HPI Kirsten Tran is a 81 y.o. female who arrives via EMS from nursing facility for fever, nausea and vomiting. Nursing staff gave Maalox and Tylenol prior to arrival. Nursing staff reports fever 101.38F prior to arrival. Patient arrives ill-appearing, in rapid atrial fibrillation with mild hypoxemia. Unable to answer questions appropriately secondary to decreased level of consciousness.   Past Medical History  Diagnosis Date  . Dementia   . Parkinson disease   . Essential hypertension   . Atrial fibrillation, unspecified   . Stroke   . Renal disorder   . Major depression, recurrent, chronic   . Schizoaffective disorder, bipolar type   . Hypercholesteremia   . Mood disorder   . Insomnia     Patient Active Problem List   Diagnosis Date Noted  . Sepsis 06/30/2014  . UTI (lower urinary tract infection) 06/30/2014  . Parkinson disease   . Atrial fibrillation, unspecified   . Dementia     No past surgical history on file.  Current Outpatient Rx  Name  Route  Sig  Dispense  Refill  . alendronate (FOSAMAX) 70 MG tablet   Oral   Take 1 tablet by mouth every 30 (thirty) days.         Marland Kitchen. alum & mag hydroxide-simeth (MAALOX/MYLANTA) 200-200-20 MG/5ML suspension   Oral   Take 30 mLs by mouth every 6 (six) hours as needed for indigestion or heartburn.         . desonide (DESOWEN) 0.05 % lotion   Topical   Apply topically.         . latanoprost (XALATAN) 0.005 % ophthalmic solution   Ophthalmic   Apply 1 drop to eye at bedtime.         . Multiple Vitamins-Minerals (MULTIVITAMIN WITH MINERALS) tablet   Oral   Take 1 tablet by mouth daily.         . polyethylene glycol powder (GLYCOLAX/MIRALAX) powder   Oral   Take 1 Dose by mouth daily.         . ARIPiprazole (ABILIFY) 2 MG tablet   Oral   Take 1 tablet by mouth daily.         . Calcium-Vitamin D 600-200 MG-UNIT per tablet   Oral   Take 1 tablet by mouth 2 (two) times daily.         . COMBIGAN 0.2-0.5 % ophthalmic solution   Both Eyes   Place 1 drop into both eyes daily.           Dispense as written.   . Emollient (CERAVE) CREA   Topical   Apply 1 application topically daily.         . enalapril (VASOTEC) 5 MG tablet   Oral   Take 1 tablet by mouth daily.         . furosemide (LASIX) 20 MG tablet   Oral   Take 1 tablet by mouth daily.         Marland Kitchen. ipratropium-albuterol (DUONEB) 0.5-2.5 (3) MG/3ML SOLN   Inhalation   Inhale 3 mLs into the lungs every 6 (six) hours as needed.         . Lactobacillus (ACIDOPHILUS) CAPS capsule   Oral  Take 2 capsules by mouth daily.         Marland Kitchen levothyroxine (SYNTHROID, LEVOTHROID) 25 MCG tablet   Oral   Take 1 tablet by mouth daily.         Marland Kitchen lithium carbonate 150 MG capsule   Oral   Take 1 capsule by mouth at bedtime.         . metoprolol (LOPRESSOR) 50 MG tablet   Oral   Take 1 tablet by mouth 2 (two) times daily.         Marland Kitchen omega-3 acid ethyl esters (LOVAZA) 1 G capsule   Oral   Take 2 capsules by mouth 2 (two) times daily.         . pravastatin (PRAVACHOL) 20 MG tablet   Oral   Take 1 tablet by mouth daily.         [name] 15 MG TABS tablet   Oral   Take 1 tablet by mouth daily.           Dispense as written.     Allergies Review of patient's allergies indicates no known allergies.  No family history on file.  Social History History  Substance Use Topics  . Smoking status: Unknown If Ever Smoked  . Smokeless tobacco: Not on file  . Alcohol Use: Not on file    Review of Systems Constitutional: Positive for fever/chills Eyes: No visual changes. ENT: No sore  throat. Cardiovascular: Denies chest pain. Respiratory: Positive for shortness of breath. Gastrointestinal: No abdominal pain.  No nausea, no vomiting.  No diarrhea.  No constipation. Genitourinary: Negative for dysuria. Musculoskeletal: Negative for back pain. Skin: Negative for rash. Neurological: Negative for headaches, focal weakness or numbness.  Limited by decreased responsiveness; 10-point ROS otherwise negative.  ____________________________________________   PHYSICAL EXAM:  VITAL SIGNS: ED Triage Vitals  Enc Vitals Group     BP --      Pulse --      Resp --      Temp --      Temp src --      SpO2 --      Weight --      Height --      Head Cir --      Peak Flow --      Pain Score --      Pain Loc --      Pain Edu? --      Excl. in GC? --     Constitutional: Alert, oriented to self only. Ill-appearing and in moderate acute distress. Eyes: Conjunctivae are normal. PERRL. EOMI. Head: Atraumatic. Nose: No congestion/rhinnorhea. Mouth/Throat: Mucous membranes are dry.  Neck: No stridor.   Cardiovascular: Irregularly irregular rhythm. Grossly normal heart sounds.  Good peripheral circulation. Respiratory: Tachypneic. Lungs diminished by basilarly. Gastrointestinal: Soft and nontender. No distention. No abdominal bruits. No CVA tenderness. Musculoskeletal: No lower extremity tenderness nor edema.  No joint effusions. Neurologic:  Oriented to self only. Follow simple commands. Moving all extremities 4.  Skin:  Skin is hot, dry and intact. No rash noted. Psychiatric: Appropriate for age and decreased responsiveness.  ____________________________________________   LABS (all labs ordered are listed, but only abnormal results are displayed)  Labs Reviewed  CBC WITH DIFFERENTIAL/PLATELET - Abnormal; Notable for the following:    WBC 12.4 (*)    Hemoglobin 16.2 (*)    HCT 49.3 (*)    Neutro Abs 11.7 (*)    Lymphs Abs 0.3 (*)    All  other components within  normal limits  COMPREHENSIVE METABOLIC PANEL - Abnormal; Notable for the following:    Glucose, Bld 161 (*)    Creatinine, Ser 1.29 (*)    AST 447 (*)    ALT 268 (*)    Total Bilirubin 3.1 (*)    GFR calc non Af Amer 37 (*)    GFR calc Af Amer 43 (*)    All other components within normal limits  TROPONIN I - Abnormal; Notable for the following:    Troponin I 0.06 (*)    All other components within normal limits  LACTIC ACID, PLASMA - Abnormal; Notable for the following:    Lactic Acid, Venous 3.8 (*)    All other components within normal limits  PROTIME-INR - Abnormal; Notable for the following:    Prothrombin Time 15.9 (*)    All other components within normal limits  BLOOD GAS, VENOUS - Abnormal; Notable for the following:    pCO2, Ven 37 (*)    All other components within normal limits  URINALYSIS COMPLETEWITH MICROSCOPIC ([facility] ONLY) - Abnormal; Notable for the following:    Color, Urine AMBER (*)    APPearance HAZY (*)    Hgb urine dipstick 2+ (*)    Protein, ur 30 (*)    Leukocytes, UA TRACE (*)    Bacteria, UA MANY (*)    All other components within normal limits  CULTURE, BLOOD (ROUTINE X 2)  CULTURE, BLOOD (ROUTINE X 2)  URINE CULTURE  LACTIC ACID, PLASMA   ____________________________________________  EKG  ED ECG REPORT I, SUNG,JADE J, the attending physician, personally viewed and interpreted this ECG.   Date: 06/30/2014  EKG Time: 0318  Rate: 139  Rhythm: atrial fibrillation, rate 139  Axis: LAD  Intervals:none  ST&T Change: Nonspecific  ____________________________________________  RADIOLOGY  Portable chest x-ray (viewed by me, interpreted by Dr. Manus Gunning): Cardiomegaly and vascular congestion. Bibasilar atelectasis. ____________________________________________   PROCEDURES  Procedure(s) performed: None  Critical Care performed:  CRITICAL CARE Performed by: Irean Hong   Total critical care time: 30  Critical care time was exclusive of  separately billable procedures and treating other patients.  Critical care was necessary to treat or prevent imminent or life-threatening deterioration.  Critical care was time spent personally by me on the following activities: development of treatment plan with patient and/or surrogate as well as nursing, discussions with consultants, evaluation of patient's response to treatment, examination of patient, obtaining history from patient or surrogate, ordering and performing treatments and interventions, ordering and review of laboratory studies, ordering and review of radiographic studies, pulse oximetry and re-evaluation of patient's condition.  ____________________________________________   INITIAL IMPRESSION / ASSESSMENT AND PLAN / ED COURSE  Pertinent labs & imaging results that were available during my care of the patient were reviewed by me and considered in my medical decision making (see chart for details).  81 year old female who presents from nursing facility with fever, hypoxemia, decreased responsiveness. Patient meets criteria for sepsis; code sepsis initiated. A. fib with RVR likely secondary to fever and infection; will attempt IV fluid resuscitation first before considering antiarrhythmics.  ----------------------------------------- 4:55 AM on 06/30/2014 -----------------------------------------  Heart rate trending down, 110 remains in A. fib. Blood pressure 97/69. Labs notable for elevated bilirubin. Will obtain bedside ultrasound to evaluate biliary tree. ____________________________________________   FINAL CLINICAL IMPRESSION(S) / ED DIAGNOSES  Final diagnoses:  Sepsis, due to unspecified organism  Atrial fibrillation with rapid ventricular response  Fever, unspecified fever cause  Pain  UTI (lower urinary tract infection)      Irean Hong, MD 06/30/14 [phone]

## 2014-06-30 NOTE — ED Notes (Signed)
MD made aware of lactic acid 3.8

## 2014-06-30 NOTE — ED Provider Notes (Signed)
-----------------------------------------   8:09 AM on 06/30/2014 -----------------------------------------  Ultrasound resulted and shows findings concerning for acute cholecystitis. Discussed with Dr. Michela PitcherEly who will come and evaluate the patient.  Patient not a surgical candidate at this point. Will continue admission hospitalist.  Phineas SemenGraydon Briante Loveall, MD 06/30/14 [phone]

## 2014-06-30 NOTE — ED Notes (Signed)
X-ray at bedside

## 2014-06-30 NOTE — ED Notes (Addendum)
Daughter Chandra BatchKaren Johnson [phone]

## 2014-06-30 NOTE — ED Notes (Signed)
Blood culture sent at 0335 one to left hand and one to right hand

## 2014-06-30 NOTE — ED Notes (Addendum)
MD at bedside. Call code sepsis

## 2014-06-30 NOTE — ED Notes (Signed)
Called report to Dana, RN 

## 2014-07-01 ENCOUNTER — Inpatient Hospital Stay: Payer: Medicare Other

## 2014-07-01 ENCOUNTER — Encounter: Payer: Self-pay | Admitting: Radiology

## 2014-07-01 DIAGNOSIS — K8010 Calculus of gallbladder with chronic cholecystitis without obstruction: Secondary | ICD-10-CM

## 2014-07-01 LAB — COMPREHENSIVE METABOLIC PANEL
ALK PHOS: 92 U/L (ref 38–126)
ALT: 151 U/L — ABNORMAL HIGH (ref 14–54)
AST: 109 U/L — AB (ref 15–41)
Albumin: 3.3 g/dL — ABNORMAL LOW (ref 3.5–5.0)
Anion gap: 10 (ref 5–15)
BILIRUBIN TOTAL: 1.1 mg/dL (ref 0.3–1.2)
BUN: 21 mg/dL — AB (ref 6–20)
CO2: 26 mmol/L (ref 22–32)
CREATININE: 1.17 mg/dL — AB (ref 0.44–1.00)
Calcium: 8.8 mg/dL — ABNORMAL LOW (ref 8.9–10.3)
Chloride: 104 mmol/L (ref 101–111)
GFR calc Af Amer: 49 mL/min — ABNORMAL LOW (ref >60)
GFR, EST NON AFRICAN AMERICAN: 42 mL/min — AB (ref >60)
Glucose, Bld: 175 mg/dL — ABNORMAL HIGH (ref 65–99)
Potassium: 4.2 mmol/L (ref 3.5–5.1)
Sodium: 140 mmol/L (ref 135–145)
Total Protein: 7.3 g/dL (ref 6.5–8.1)

## 2014-07-01 LAB — CBC
HCT: 44.5 % (ref 35.0–47.0)
Hemoglobin: 14.4 g/dL (ref 12.0–16.0)
MCH: 32 pg (ref 26.0–34.0)
MCHC: 32.4 g/dL (ref 32.0–36.0)
MCV: 99 fL (ref 80.0–100.0)
PLATELETS: 134 10*3/uL — AB (ref 150–440)
RBC: 4.49 MIL/uL (ref 3.80–5.20)
RDW: 13.2 % (ref 11.5–14.5)
WBC: 15 10*3/uL — ABNORMAL HIGH (ref 3.6–11.0)

## 2014-07-01 LAB — BASIC METABOLIC PANEL
ANION GAP: 6 (ref 5–15)
BUN: 18 mg/dL (ref 6–20)
CALCIUM: 8.3 mg/dL — AB (ref 8.9–10.3)
CO2: 27 mmol/L (ref 22–32)
CREATININE: 1.04 mg/dL — AB (ref 0.44–1.00)
Chloride: 106 mmol/L (ref 101–111)
GFR calc non Af Amer: 49 mL/min — ABNORMAL LOW (ref >60)
GFR, EST AFRICAN AMERICAN: 56 mL/min — AB (ref >60)
Glucose, Bld: 164 mg/dL — ABNORMAL HIGH (ref 65–99)
POTASSIUM: 4.3 mmol/L (ref 3.5–5.1)
SODIUM: 139 mmol/L (ref 135–145)

## 2014-07-01 MED ORDER — GUAIFENESIN-CODEINE 100-10 MG/5ML PO SOLN
10.0000 mL | ORAL | Status: DC | PRN
  Administered 2014-07-01 – 2014-07-03 (×4): 10 mL via ORAL
  Filled 2014-07-01 (×4): qty 10

## 2014-07-01 MED ORDER — IOHEXOL 300 MG/ML  SOLN
100.0000 mL | Freq: Once | INTRAMUSCULAR | Status: AC | PRN
  Administered 2014-07-01: 80 mL via INTRAVENOUS

## 2014-07-01 MED ORDER — IOHEXOL 240 MG/ML SOLN
25.0000 mL | INTRAMUSCULAR | Status: AC
  Administered 2014-07-01 (×2): 25 mL via ORAL

## 2014-07-01 NOTE — Progress Notes (Signed)
RN called to inform RT that patient woke up sick and threw up in CPAP mask. Patient taken off mask and placed back on nasal cannula.

## 2014-07-01 NOTE — Progress Notes (Signed)
Subjective:   She is much more alert today than yesterday. She continues to have some nausea and abdominal pain. She did vomit this morning. Her laboratory values are improved. She continues complaining some mild midepigastric abdominal discomfort.  Vital signs in last 24 hours: Temp:  [98.4 F (36.9 C)-99.3 F (37.4 C)] 98.4 F (36.9 C) (06/04 0933) Pulse Rate:  [89-124] 124 (06/04 0933) Resp:  [17-26] 18 (06/04 0933) BP: (105-152)/(45-87) 131/87 mmHg (06/04 0933) SpO2:  [93 %-100 %] 95 % (06/04 0933) Weight:  [121.02 kg (266 lb 12.8 oz)-121.201 kg (267 lb 3.2 oz)] 121.201 kg (267 lb 3.2 oz) (06/04 0435) Last BM Date: 06/29/14  Intake/Output from previous day: 06/03 0701 - 06/04 0700 In: 240 [P.O.:240] Out: 775 [Urine:775]  Exam:  Her abdominal pain is moderate. She does not have any significant rebound or point tenderness.  Lab Results:  CBC  Recent Labs  06/30/14 0335 07/01/14 0606  WBC 12.4* 15.0*  HGB 16.2* 14.4  HCT 49.3* 44.5  PLT 176 134*   CMP     Component Value Date/Time   NA 139 07/01/2014 0606   K 4.3 07/01/2014 0606   CL 106 07/01/2014 0606   CO2 27 07/01/2014 0606   GLUCOSE 164* 07/01/2014 0606   BUN 18 07/01/2014 0606   CREATININE 1.04* 07/01/2014 0606   CALCIUM 8.3* 07/01/2014 0606   PROT 7.6 06/30/2014 0335   ALBUMIN 3.6 06/30/2014 0335   AST 447* 06/30/2014 0335   ALT 268* 06/30/2014 0335   ALKPHOS 126 06/30/2014 0335   BILITOT 3.1* 06/30/2014 0335   GFRNONAA 49* 07/01/2014 0606   GFRAA 56* 07/01/2014 0606   PT/INR  Recent Labs  06/30/14 0335  LABPROT 15.9*  INR 1.25    Studies/Results: Dg Chest Port 1 View  06/30/2014   CLINICAL DATA:  Code sepsis. Altered mental status. Vomiting. Tachypnea, atrial fibrillation.  EXAM: PORTABLE CHEST - 1 VIEW  COMPARISON:  Frontal and lateral views 02/16/2007  FINDINGS: The heart is enlarged. There is vascular congestion. Bibasilar atelectasis, right greater than left. No frank pulmonary edema. No  large pleural effusion. No pneumothorax. Calcified granuloma again seen in the left upper lung.  IMPRESSION: Cardiomegaly and vascular congestion.  Bibasilar atelectasis.   Electronically Signed   By: Rubye Oaks M.D.   On: 06/30/2014 03:59   US Abdomen Limited Ruq  06/30/2014   CLINICAL DATA:  Abdominal pain and sepsis  EXAM: US ABDOMEN LIMITED - RIGHT UPPER QUADRANT  COMPARISON:  None.  FINDINGS: Gallbladder:  Within the gallbladder, there are multiple echogenic foci which shadow but do not move, felt to represent adherent gallstones. The largest gallstone measures 1.6 cm in length. There is mild gallbladder wall thickening with pericholecystic fluid. Patient is tender over the gallbladder.  Common bile duct:  Diameter: 5 mm. There is no intrahepatic or extrahepatic biliary duct dilatation.  Liver:  No focal lesion identified. Liver echogenicity is diffusely increased.  IMPRESSION: The appearance of the gallbladder is concerning for acute cholecystitis.  Liver echogenicity is diffusely increased, most likely due to hepatic steatosis. While no focal liver lesions are identified, it must be cautioned that the sensitivity of ultrasound for focal liver lesions is diminished in this circumstance.  These results will be called to the ordering clinician or representative by the Radiologist Assistant, and communication documented in the PACS or zVision Dashboard.   Electronically Signed   By: Bretta Bang III M.D.   On: 06/30/2014 07:30    Assessment/Plan: She is  markedly improved from my examination yesterday. Her sepsis appears to be well under control. At the present time it is unclear whether her infection was due to urinary tract sepsis or possible biliary tract sepsis. When she has improved to baseline I would suggest considering CT scan to reevaluate her abdominal cavity in her gallbladder. She would be a poor surgical candidate with her medical problems. I would recommend possible percutaneous  drainage. We will continue to follow her.

## 2014-07-01 NOTE — Care Management Note (Signed)
Case Management Note  Patient Details  Name: Charolette ForwardMarjorie J Tworek MRN: [id number] Date of Birth: 08/29/1930  Subjective/Objective:         Per Sheppard PentonKaren Thomas at [facility], only patients who [facility] have a 21 day waiting period for LTAC. Mrs Earl ManyBarnwell has Medicare A-B. Sheppard PentonKaren Thomas [facility] and reports that Mrs Earl ManyBarnwell is not eligible for LTAC because she does not have an eligible Revenue Code for Medicare A-B.            Action/Plan:   Expected Discharge Date:  07/03/14               Expected Discharge Plan:  Skilled Nursing Facility  In-House Referral:  Clinical Social Work  Discharge planning Services  CM Consult  Post Acute Care Choice:  IP Rehab Choice offered to:  Patient  DME Arranged:    DME Agency:     HH Arranged:    HH Agency:     Status of Service:     Medicare Important Message Given:    Date Medicare IM Given:    Medicare IM give by:    Date Additional Medicare IM Given:    Additional Medicare Important Message give by:     If discussed at Long Length of Stay Meetings, dates discussed:    Additional Comments:  Brynna Dobos A, RN 07/01/2014, 3:23 PM

## 2014-07-01 NOTE — Progress Notes (Signed)
Per chart review, pt admitted from [facility]. No family at bedside. Per MD order, possible LTAC? RNCM aware. Anticipate return to SNF if not appropriate for LTAC.  Dellie BurnsJosie Anyely Cunning, MSW, LCSW [phone] (weekend coverage)

## 2014-07-01 NOTE — Progress Notes (Addendum)
[facility] [place] [facility]   PATIENT NAME: Kirsten GuessMarjorie Fullbright    MR#:  [id number]  DATE OF BIRTH:  29-May-1930  SUBJECTIVE:  CHIEF COMPLAINT:   Chief Complaint  Patient presents with  . Code Sepsis    ams vomiting afib rvr fever tachyapnea   -Admitted with fever and confusion.Noted to be septic. - acute cystitis and also cholecystitis - denies any nausea, vomiting or abdominal pain now - requesting to eat something  REVIEW OF SYSTEMS:  ROS  Constitutional: Denies any fever or chills today Respiratory: Negative for cough, shortness of breath and wheezing.  Cardiovascular: Negative for chest pain and palpitations.  Gastrointestinal: Positive for nausea, vomiting and abdominal pain. Negative for diarrhea and constipation.  Genitourinary: Negative for dysuria.  Neurological: Negative for dizziness, seizures and headaches.  DRUG ALLERGIES:  No Known Allergies  VITALS:  Blood pressure 112/68, pulse 106, temperature 98.2 F (36.8 C), temperature source Oral, resp. rate 20, height 5\' 5"  (1.651 m), weight 121.201 kg (267 lb 3.2 oz), SpO2 95 %.  PHYSICAL EXAMINATION:  Physical Exam  GENERAL:  81 y.o.-year-old patient lying in the bed with no acute distress.  EYES: Pupils equal, round, reactive to light and accommodation. No scleral icterus. Extraocular muscles intact.  HEENT: Head atraumatic, normocephalic. Oropharynx and nasopharynx clear.  NECK:  Supple, no jugular venous distention. No thyroid enlargement, no tenderness.  LUNGS: Normal breath sounds bilaterally, decreased at the bases, no wheezing, rales,rhonchi or crepitation. No use of accessory muscles of respiration. Appears dyspneic. CARDIOVASCULAR: S1, S2 normal. No murmurs, rubs, or gallops.  ABDOMEN: Soft,  lower abdominal tenderness, nondistended. Bowel sounds present. No organomegaly or mass.  No right upper quadrant tenderness. EXTREMITIES: No pedal edema, cyanosis, or clubbing.   NEUROLOGIC: Cranial nerves II through XII are intact. Muscle strength 5/5 in all extremities. Sensation intact. Gait not checked.  PSYCHIATRIC: The patient is alert and oriented x 2-3.  SKIN: No obvious rash, lesion, or ulcer.    LABORATORY PANEL:   CBC  Recent Labs Lab 07/01/14 0606  WBC 15.0*  HGB 14.4  HCT 44.5  PLT 134*   ------------------------------------------------------------------------------------------------------------------  Chemistries   Recent Labs Lab 06/30/14 0335 07/01/14 0606  NA 140 139  K 4.4 4.3  CL 101 106  CO2 26 27  GLUCOSE 161* 164*  BUN 19 18  CREATININE 1.29* 1.04*  CALCIUM 9.3 8.3*  AST 447*  --   ALT 268*  --   ALKPHOS 126  --   BILITOT 3.1*  --    ------------------------------------------------------------------------------------------------------------------  Cardiac Enzymes  Recent Labs Lab 06/30/14 0335  TROPONINI 0.06*   ------------------------------------------------------------------------------------------------------------------  RADIOLOGY:  Dg Chest Port 1 View  06/30/2014   CLINICAL DATA:  Code sepsis. Altered mental status. Vomiting. Tachypnea, atrial fibrillation.  EXAM: PORTABLE CHEST - 1 VIEW  COMPARISON:  Frontal and lateral views 02/16/2007  FINDINGS: The heart is enlarged. There is vascular congestion. Bibasilar atelectasis, right greater than left. No frank pulmonary edema. No large pleural effusion. No pneumothorax. Calcified granuloma again seen in the left upper lung.  IMPRESSION: Cardiomegaly and vascular congestion.  Bibasilar atelectasis.   Electronically Signed   By: Rubye OaksMelanie  Ehinger M.D.   On: 06/30/2014 03:59   [place] Abdomen Limited Ruq  06/30/2014   CLINICAL DATA:  Abdominal pain and sepsis  EXAM: US ABDOMEN LIMITED - RIGHT UPPER QUADRANT  COMPARISON:  None.  FINDINGS: Gallbladder:  Within the gallbladder, there are multiple echogenic foci which shadow but do not  move, felt to represent adherent  gallstones. The largest gallstone measures 1.6 cm in length. There is mild gallbladder wall thickening with pericholecystic fluid. Patient is tender over the gallbladder.  Common bile duct:  Diameter: 5 mm. There is no intrahepatic or extrahepatic biliary duct dilatation.  Liver:  No focal lesion identified. Liver echogenicity is diffusely increased.  IMPRESSION: The appearance of the gallbladder is concerning for acute cholecystitis.  Liver echogenicity is diffusely increased, most likely due to hepatic steatosis. While no focal liver lesions are identified, it must be cautioned that the sensitivity of ultrasound for focal liver lesions is diminished in this circumstance.  These results will be called to the ordering clinician or representative by the Radiologist Assistant, and communication documented in the PACS or zVision Dashboard.   Electronically Signed   By: Bretta Bang III M.D.   On: 06/30/2014 07:30    EKG:  No orders found for this or any previous visit.  ASSESSMENT AND PLAN:   81y/oM with PMH of Dementia, Parkinsons disease, HTN, Afib, CVA, CKD, Depression, schizoaffective disorder admitted for sepsis  * Sepsis- likely source UTI with hematuria and acute cholecystitis - Blood and urine cultures are  pending now, monitor hemoglobin and hematocrit - will continue empiric invanz for UTI and intra abd infection with sepsis - BP stable for now - IV fluids Will discontinue Xarelto if hematuria gets worse-  * Acute cholecystitis-incidental finding on [place] abd - pt denies any RUQ pain, nausea or vomiting today- though she had nausea, vomiting yesterday - appreciate surgical consult-recommending CAT scan of the abdomen and pelvis -Patient is not a surgical candidate they might consider percutaneous drainage if needed - cont clear liquid diet for today -Status post speech evaluation patient refused bolus trials, continue thin liquid diet for now,  * Afib- rate controlled, on  metoprolol On xarelto. Cont anticoagulation - as h/o CVA- will need bridging if needs surgery Stable for now  * HTN-monitor BP, with sepsis Currently on metoprolol, enalapril and lasix- decrease metoprolol dose ad discontinue enalapril continue lasix  As CXR with vascular congestion  * Schizoaffective disorder- on lithium and abilify, stable  * CKD-monitor, while on lasix and esp with sepsis. Will hold off fluids today as CXR with congestion and some dyspnea on exam  * DVT prophylaxis- on xarelto   Physical therapy consult.  All the records are reviewed and case discussed with Care Management/Social Workerr. Management plans discussed with the patient, family and they are in agreement.  CODE STATUS: Full code  TOTAL TIME TAKING CARE OF THIS PATIENT: 38 minutes.   POSSIBLE D/C IN 2 DAYS, DEPENDING ON CLINICAL CONDITION.   Ramonita Lab M.D on 07/01/2014 at 2:26 PM  Between 7am to 6pm - Pager - [phone]  After 6pm go to [email or web address] - password [email or web address] [facility]  [facility]  Office  [phone]  CC: Primary care physician; No primary care provider on file.

## 2014-07-01 NOTE — Evaluation (Signed)
Speech Language Pathology Evaluation Patient Details Name: Kirsten Tran MRN: [id number] DOB: 01-06-1931 Today's Date: 07/01/2014 Time: [id number] SLP Time Calculation (min) (ACUTE ONLY): 35 min  Problem List:  Patient Active Problem List   Diagnosis Date Noted  . Sepsis 06/30/2014  . UTI (lower urinary tract infection) 06/30/2014  . Parkinson disease   . Atrial fibrillation, unspecified   . Dementia    Past Medical History:  Past Medical History  Diagnosis Date  . Dementia   . Parkinson disease   . Essential hypertension   . Atrial fibrillation, unspecified   . Stroke   . Renal disorder   . Major depression, recurrent, chronic   . Schizoaffective disorder, bipolar type   . Hypercholesteremia   . Mood disorder   . Insomnia    Past Surgical History: History reviewed. No pertinent past surgical history. HPI:      Assessment / Plan / Recommendation Clinical Impression  Pt presents with possible phayrngeal dysphagia charactorized by pt coughing with thin liquids as reported by nursing staff. ST only able to administer x1 sip thin liquids as pt reports being nausous and regurgitation of thin liquids prior to st arrival. pt refused further bolus trials. pt will remain on thin liquid diet due to medical status and possible gallstone surgury consult.     SLP Assessment    Pt with possible pharyngeal dysphagia, however difficult to assess as pt is having nausea and regurgitation of thin liquids due to possible gallstones indicated during previous medical assessment. Pt refused po trials during st assessment and st educated pt on aspiration precautions and ST recommended to remain on current diet at this time due to risk. RN in room and notified of st recommendations. Pt and nursing agreed to poc.    Follow Up Recommendations       Frequency and Duration min 3x week      Pertinent Vitals/Pain Pain Assessment: No/denies pain   SLP Goals  Patient/Family Stated Goal: pt  "wants to feel better"  SLP Evaluation Prior Functioning   pt states being on reg with thin previously.    Cognition  Orientation Level: Oriented to situation    Comprehension       Expression     Oral / Motor Oral Motor/Sensory Function Overall Oral Motor/Sensory Function: Appears within functional limits for tasks assessed   GO     Kirsten PelStacie Tran [facility] 07/01/2014, 11:32 AM

## 2014-07-01 NOTE — Evaluation (Signed)
Physical Therapy Evaluation Patient Details Name: Kirsten Tran MRN: [id number] DOB: [birth year] Today's Date: 07/01/2014   History of Present Illness  The patient presents emergency department via EMS from her nursing home where she was apparently vomiting and febrile. Patient was found to be tachycardic upon admission; she was admitted with UTI/possible sepsis; Patient has a PMH significant for dementia/parkinson's disease and schizoaffective disorder; She was living at [facility] prior to admittance  Clinical Impression  81 yo Pleasant female presents to hospital with nausea/vomitting and tachycardia being diagnosed with UTI/Sepsis. Patient is from [facility] healthcare SNF. She reports being able to ambulate without AD prior to admittance. She currently is supervision for bed mobility, min A with sit<>stand transfers and min A with gait tasks requiring RW for safety. Patient ambulates on even surface with narrow base of support, unsteady gait, and severe flexed posture. She would benefit from additional skilled PT intervention to improve balance/gait safety and LE strength. PT recommends home health PT at SNF upon discharge for patient to continue to return to PLOF. Patient agreeable.    Follow Up Recommendations Home health PT    Equipment Recommendations       Recommendations for Other Services       Precautions / Restrictions Precautions Precautions: Fall Restrictions Weight Bearing Restrictions: No      Mobility  Bed Mobility Overal bed mobility: Needs Assistance Bed Mobility: Supine to Sit     Supine to sit: Supervision     General bed mobility comments: Patient transitions into sitting, supervision requiring bed rails and elevated head of bed; able to slide legs off bed independently;  Transfers Overall transfer level: Needs assistance Equipment used: Rolling walker (2 wheeled) Transfers: Sit to/from Stand Sit to Stand: Min assist         General  transfer comment: Transfers sit<>Stand, min A with cues for hand placement and to increase forward lean for better transfer ability.  Ambulation/Gait Ambulation/Gait assistance: Min assist Ambulation Distance (Feet): 60 Feet Assistive device: Rolling walker (2 wheeled) Gait Pattern/deviations: Step-to pattern;Step-through pattern;Decreased step length - right;Decreased step length - left;Shuffle;Narrow base of support Gait velocity: decreased   General Gait Details: Patient ambulates slowly requiring min A for safety, demonstrating step to and step through gait pattern (inconsistent) with narrow base of support; requires increased cues for erect posture and to increase step length for better gait safety;   Stairs            Wheelchair Mobility    Modified Rankin (Stroke Patients Only)       Balance Overall balance assessment: Needs assistance Sitting-balance support: Single extremity supported Sitting balance-Leahy Scale: Good Sitting balance - Comments: able to keep balance independently sitting edge of bed;   Standing balance support: Bilateral upper extremity supported Standing balance-Leahy Scale: Fair Standing balance comment: able to stand with RW, close supervision                             Pertinent Vitals/Pain Pain Assessment: No/denies pain    Home Living Family/patient expects to be discharged to:: Skilled nursing facility                 Additional Comments: [facility]    Prior Function Level of Independence: Independent;Needs assistance      ADL's / Homemaking Assistance Needed: needed assistance with some bathing/dressing  Comments: reports walking without AD;      Hand Dominance  Extremity/Trunk Assessment   Upper Extremity Assessment: Overall WFL for tasks assessed           Lower Extremity Assessment: Overall WFL for tasks assessed (BLE gross strength is 4-/5)         Communication    Communication: Expressive difficulties (speaks slowly)  Cognition Arousal/Alertness: Awake/alert Behavior During Therapy: WFL for tasks assessed/performed Overall Cognitive Status: Within Functional Limits for tasks assessed                      General Comments General comments (skin integrity, edema, etc.): good skin integrity; denies any numbness/tingling;    Exercises        Assessment/Plan    PT Assessment Patient needs continued PT services  PT Diagnosis Difficulty walking;Generalized weakness   PT Problem List Decreased strength;Obesity;Decreased activity tolerance;Decreased balance;Decreased mobility;Decreased safety awareness  PT Treatment Interventions Balance training;Gait training;Neuromuscular re-education;Functional mobility training;Patient/family education;Therapeutic activities;Therapeutic exercise   PT Goals (Current goals can be found in the Care Plan section) Acute Rehab PT Goals Patient Stated Goal: to go back to  healthcare PT Goal Formulation: With patient Time For Goal Achievement: 07/15/14 Potential to Achieve Goals: Good    Frequency Min 2X/week   Barriers to discharge   none    Co-evaluation               End of Session Equipment Utilized During Treatment: Gait belt Activity Tolerance: Patient tolerated treatment well Patient left: in chair;with call bell/phone within reach;with chair alarm set Nurse Communication: Mobility status         Time: [id number] PT Time Calculation (min) (ACUTE ONLY): 31 min   Charges:   PT Evaluation $Initial PT Evaluation Tier I: 1 Procedure     PT G Codes:        Hopkins,Margaret, PT, DPT 07/01/2014, 3:57 PM

## 2014-07-01 NOTE — Care Management Note (Deleted)
Case Management Note  Patient Details  Name: Charolette ForwardMarjorie J Barrack MRN: [id number] Date of Birth: [birth year]  Subjective/Objective:         81yo Mrs Duanne GuessMarjorie Bunney is a resident of [facility]. She was admitted with a diagnosis of sepsis. Case Management received a consult for "Long Term Acute Care." To be eligible for LTAC a patient must be inpatient for 21 days.  Mrs Earl ManyBarnwell was admitted to [facility] on 06/30/14. If Mrs Earl ManyBarnwell does not return to [facility] within 21 days of hospital admission, the she will be considered for LTAC placement.          Action/Plan:   Expected Discharge Date:                 Expected Discharge Plan:  Return to [facility]   In-House Referral:     Discharge planning Services     Post Acute Care Choice:    Choice offered to:     DME Arranged:    DME Agency:     HH Arranged:    HH Agency:     Status of Service:     Medicare Important Message Given:    Date Medicare IM Given:    Medicare IM give by:    Date Additional Medicare IM Given:    Additional Medicare Important Message give by:     If discussed at Long Length of Stay Meetings, dates discussed:    Additional Comments:  Blaise Grieshaber A, RN 07/01/2014, 2:33 PM

## 2014-07-01 NOTE — Progress Notes (Signed)
Patient with a period of tachycardia on telemetry. Patient assessed and was found s/p vomiting, with emesis in her CPAP. Patient states she was coughing so hard she gagged and threw up. Zofran administered. Order obtained for PRN cough medicine. After patient was cleaned up and medications were given, patient rested in between care. Will continue to monitor.

## 2014-07-02 ENCOUNTER — Inpatient Hospital Stay: Payer: Medicare Other

## 2014-07-02 DIAGNOSIS — K8010 Calculus of gallbladder with chronic cholecystitis without obstruction: Secondary | ICD-10-CM

## 2014-07-02 LAB — CBC
HEMATOCRIT: 41.3 % (ref 35.0–47.0)
Hemoglobin: 13.7 g/dL (ref 12.0–16.0)
MCH: 32.6 pg (ref 26.0–34.0)
MCHC: 33.1 g/dL (ref 32.0–36.0)
MCV: 98.4 fL (ref 80.0–100.0)
PLATELETS: 140 10*3/uL — AB (ref 150–440)
RBC: 4.19 MIL/uL (ref 3.80–5.20)
RDW: 13.3 % (ref 11.5–14.5)
WBC: 10.9 10*3/uL (ref 3.6–11.0)

## 2014-07-02 MED ORDER — FUROSEMIDE 10 MG/ML IJ SOLN
20.0000 mg | Freq: Once | INTRAMUSCULAR | Status: AC
  Administered 2014-07-02: 20 mg via INTRAVENOUS
  Filled 2014-07-02: qty 2

## 2014-07-02 MED ORDER — ALBUTEROL SULFATE (2.5 MG/3ML) 0.083% IN NEBU: 2.5000 mg | INHALATION_SOLUTION | RESPIRATORY_TRACT | Status: DC | PRN

## 2014-07-02 MED ORDER — VANCOMYCIN HCL 10 G IV SOLR
1250.0000 mg | INTRAVENOUS | Status: DC
  Administered 2014-07-02 – 2014-07-03 (×2): 1250 mg via INTRAVENOUS
  Filled 2014-07-02 (×3): qty 1250

## 2014-07-02 NOTE — Progress Notes (Signed)
Subjective:   She continues to improve. She does not have any significant abdominal pain at this point. She is taking a liquid diet. Her blood pressures improved her overall laboratory values are much better. Her bilirubin is down to 1.1.  Vital signs in last 24 hours: Temp:  [97.7 F (36.5 C)-99.3 F (37.4 C)] 99.3 F (37.4 C) (06/05 1200) Pulse Rate:  [83-110] 83 (06/05 1200) Resp:  [18-20] 20 (06/05 1200) BP: (114-142)/(77-86) 114/78 mmHg (06/05 1200) SpO2:  [97 %-100 %] 98 % (06/05 1200) Weight:  [120.158 kg (264 lb 14.4 oz)] 120.158 kg (264 lb 14.4 oz) (06/05 0532) Last BM Date: 07/01/14  Intake/Output from previous day: 06/04 0701 - 06/05 0700 In: -  Out: 551 [Urine:550; Stool:1]  Exam:  She does not have any significant abdominal tenderness point tenderness rebound or guarding on examination.  Lab Results:  CBC  Recent Labs  07/01/14 0606 07/02/14 0616  WBC 15.0* 10.9  HGB 14.4 13.7  HCT 44.5 41.3  PLT 134* 140*   CMP     Component Value Date/Time   NA 140 07/01/2014 1514   K 4.2 07/01/2014 1514   CL 104 07/01/2014 1514   CO2 26 07/01/2014 1514   GLUCOSE 175* 07/01/2014 1514   BUN 21* 07/01/2014 1514   CREATININE 1.17* 07/01/2014 1514   CALCIUM 8.8* 07/01/2014 1514   PROT 7.3 07/01/2014 1514   ALBUMIN 3.3* 07/01/2014 1514   AST 109* 07/01/2014 1514   ALT 151* 07/01/2014 1514   ALKPHOS 92 07/01/2014 1514   BILITOT 1.1 07/01/2014 1514   GFRNONAA 42* 07/01/2014 1514   GFRAA 49* 07/01/2014 1514   PT/INR  Recent Labs  06/30/14 0335  LABPROT 15.9*  INR 1.25    Studies/Results: Dg Chest 2 View  07/02/2014   CLINICAL DATA:  Cough. Shortness of breath. Atrial fibrillation. Urosepsis.  EXAM: CHEST  2 VIEW  COMPARISON:  06/30/2014  FINDINGS: Mild cardiomegaly stable.  No evidence of congestive heart failure.  Increased opacity is noted in the left lung base which may be due to atelectasis or infiltrate. Right lung remains clear. No evidence of pleural  effusion.  IMPRESSION: Increased left basilar atelectasis versus infiltrate.  Stable mild cardiomegaly.   Electronically Signed   By: Myles RosenthalJohn  Stahl M.D.   On: 07/02/2014 11:42   Ct Abdomen Pelvis W Contrast  07/01/2014   CLINICAL DATA:  Nausea and vomiting for 2 days, history Parkinson's disease, dementia, hypertension, stroke, atrial fibrillation  EXAM: CT ABDOMEN AND PELVIS WITH CONTRAST  TECHNIQUE: Multidetector CT imaging of the abdomen and pelvis was performed using the standard protocol following bolus administration of intravenous contrast. Sagittal and coronal MPR images reconstructed from axial data set.  CONTRAST:  80mL OMNIPAQUE IOHEXOL 300 MG/ML SOLN IV. Dilute oral contrast.  COMPARISON:  None  FINDINGS: Bibasilar atelectasis.  RIGHT renal cyst 3.0 x 2.5 cm.  Age-related renal cortical atrophy.  Multiple gallstones in gallbladder, which appears contracted.  Nonspecific 10 mm low-attenuation focus posterior RIGHT lobe liver image 16.  Liver, spleen, pancreas, kidneys and adrenal glands otherwise normal appearance.  Mild descending and sigmoid colonic diverticulosis without evidence of diverticulitis.  Stomach and bowel loops otherwise normal appearance.  Uterus surgically absent with nonvisualization of ovaries and appendix.  Bladder decompressed by Foley catheter with unremarkable ureters.  Scattered atherosclerotic calcifications.  No mass, adenopathy, free fluid, free air, or inflammatory process.  Suspect paraesophageal herniation of fat into the inferior mediastinum.  Mitral annular and question aortic valvular calcifications  noted.  Bones demineralized with degenerative disc and facet disease changes of the lumbar spine associated with biconvex scoliosis.  IMPRESSION: Contracted gallbladder containing multiple gallstones.  Atrophic kidneys with small RIGHT renal cyst.  Distal colonic diverticulosis without evidence of diverticulitis.  No definite acute intra-abdominal or intrapelvic process  identify.   Electronically Signed   By: Ulyses Southward M.D.   On: 07/01/2014 19:58    Assessment/Plan: She does appear to have evidence for possible biliary tract disease as the source of her sepsis. She overall seems to be improving. She is a very poor surgical candidate. Review of her CT scan demonstrates a shrunken small gallbladder which is probably not amenable to percutaneous drainage. I would continue to treat her with antibiotics in hopes of avoiding a surgical intervention. I would advance her diet and continue her Xarelto.

## 2014-07-02 NOTE — Progress Notes (Signed)
[facility] - [place] at [facility]   PATIENT NAME: Kirsten Tran    MR#:  [id number]  DATE OF BIRTH:  22-Mar-1930  SUBJECTIVE:  CHIEF COMPLAINT:   Chief Complaint  Patient presents with  . Code Sepsis    ams vomiting afib rvr fever tachyapnea   -Admitted with fever and confusion.Noted to be septic. - acute cystitis and also cholecystitis - denies any nausea, vomiting or abdominal pain now - Today patient is diffusely wheezing but denies any chest tightness, not on any IV fluids  REVIEW OF SYSTEMS:  ROS  Constitutional: Denies any fever or chills today Respiratory: Negative for cough, shortness of breath and wheezing.  Cardiovascular: Negative for chest pain and palpitations.  Gastrointestinal: Positive for nausea, vomiting and abdominal pain. Negative for diarrhea and constipation.  Genitourinary: Negative for dysuria.  Neurological: Negative for dizziness, seizures and headaches.  DRUG ALLERGIES:  No Known Allergies  VITALS:  Blood pressure 114/78, pulse 83, temperature 99.3 F (37.4 C), temperature source Oral, resp. rate 20, height  (1.651 m), weight 120.158 kg (264 lb 14.4 oz), SpO2 98 %.  PHYSICAL EXAMINATION:  Physical Exam  GENERAL:  81 y.o.-year-old patient lying in the bed with no acute distress.  EYES: Pupils equal, round, reactive to light and accommodation. No scleral icterus. Extraocular muscles intact.  HEENT: Head atraumatic, normocephalic. Oropharynx and nasopharynx clear.  NECK:  Supple, no jugular venous distention. No thyroid enlargement, no tenderness.  LUNGS:  decreased at the bases, diffuse wheezing, positive rales and crepitation. No use of accessory muscles of respiration. Appears dyspneic. CARDIOVASCULAR: S1, S2 normal. No murmurs, rubs, or gallops.  ABDOMEN: Soft,  lower abdominal tenderness, nondistended. Bowel sounds present. No organomegaly or mass.  No right upper quadrant tenderness. EXTREMITIES: No pedal  edema, cyanosis, or clubbing.  NEUROLOGIC: Cranial nerves II through XII are intact. Muscle strength 5/5 in all extremities. Sensation intact. Gait not checked.  PSYCHIATRIC: The patient is alert and oriented x 2-3.  SKIN: No obvious rash, lesion, or ulcer.    LABORATORY PANEL:   CBC  Recent Labs Lab 07/02/14 0616  WBC 10.9  HGB 13.7  HCT 41.3  PLT 140*   ------------------------------------------------------------------------------------------------------------------  Chemistries   Recent Labs Lab 07/01/14 1514  NA 140  K 4.2  CL 104  CO2 26  GLUCOSE 175*  BUN 21*  CREATININE 1.17*  CALCIUM 8.8*  AST 109*  ALT 151*  ALKPHOS 92  BILITOT 1.1   ------------------------------------------------------------------------------------------------------------------  Cardiac Enzymes  Recent Labs Lab 06/30/14 0335  TROPONINI 0.06*   ------------------------------------------------------------------------------------------------------------------  RADIOLOGY:  Dg Chest 2 View  07/02/2014   CLINICAL DATA:  Cough. Shortness of breath. Atrial fibrillation. Urosepsis.  EXAM: CHEST  2 VIEW  COMPARISON:  06/30/2014  FINDINGS: Mild cardiomegaly stable.  No evidence of congestive heart failure.  Increased opacity is noted in the left lung base which may be due to atelectasis or infiltrate. Right lung remains clear. No evidence of pleural effusion.  IMPRESSION: Increased left basilar atelectasis versus infiltrate.  Stable mild cardiomegaly.   Electronically Signed   By: Myles Rosenthal M.D.   On: 07/02/2014 11:42   Ct Abdomen Pelvis W Contrast  07/01/2014   CLINICAL DATA:  Nausea and vomiting for 2 days, history Parkinson's disease, dementia, hypertension, stroke, atrial fibrillation  EXAM: CT ABDOMEN AND PELVIS WITH CONTRAST  TECHNIQUE: Multidetector CT imaging of the abdomen and pelvis was performed using the standard protocol following bolus administration of intravenous contrast.  Sagittal and  coronal MPR images reconstructed from axial data set.  CONTRAST:  80mL OMNIPAQUE IOHEXOL 300 MG/ML SOLN IV. Dilute oral contrast.  COMPARISON:  None  FINDINGS: Bibasilar atelectasis.  RIGHT renal cyst 3.0 x 2.5 cm.  Age-related renal cortical atrophy.  Multiple gallstones in gallbladder, which appears contracted.  Nonspecific 10 mm low-attenuation focus posterior RIGHT lobe liver image 16.  Liver, spleen, pancreas, kidneys and adrenal glands otherwise normal appearance.  Mild descending and sigmoid colonic diverticulosis without evidence of diverticulitis.  Stomach and bowel loops otherwise normal appearance.  Uterus surgically absent with nonvisualization of ovaries and appendix.  Bladder decompressed by Foley catheter with unremarkable ureters.  Scattered atherosclerotic calcifications.  No mass, adenopathy, free fluid, free air, or inflammatory process.  Suspect paraesophageal herniation of fat into the inferior mediastinum.  Mitral annular and question aortic valvular calcifications noted.  Bones demineralized with degenerative disc and facet disease changes of the lumbar spine associated with biconvex scoliosis.  IMPRESSION: Contracted gallbladder containing multiple gallstones.  Atrophic kidneys with small RIGHT renal cyst.  Distal colonic diverticulosis without evidence of diverticulitis.  No definite acute intra-abdominal or intrapelvic process identify.   Electronically Signed   By: Ulyses SouthwardMark  Boles M.D.   On: 07/01/2014 19:58    EKG:  No orders found for this or any previous visit.  ASSESSMENT AND PLAN:   81y/oM with PMH of Dementia, Parkinsons disease, HTN, Afib, CVA, CKD, Depression, schizoaffective disorder admitted for sepsis  * Sepsis- likely source UTI with hematuria , healthcare associated pneumonia on the left lower lobe and acute cholecystitis - Blood and urine cultures are  pending now, monitor hemoglobin and hematocrit - will continue empiric invanz for UTI and intra abd  infection with sepsis. We will add vancomycin for possible pneumonia. Incentive spirometry - BP stable for now - IV fluids-discontinued -Hematuria is improving, no gross hematuria in the urine bag  * Acute cholecystitis-incidental finding on US abd - - appreciate surgical consult- CAT scan of the abdomen and pelvis has revealed contracted gallbladder with multiple gallstones, discussed with Dr. Michela PitcherEly. He might consider placing percutaneous stent tomorrow, if needed he will consider holding Xarelto -Patient is not a surgical candidate  - cont clear liquid diet for today -Status post speech evaluation patient refused bolus trials, continue thin liquid diet for now,  * Afib- rate controlled, on metoprolol On xarelto. Cont anticoagulation - as h/o CVA- will need bridging if needs surgery Stable for now  * HTN-monitor BP, with sepsis Currently on metoprolol, enalapril and lasix- decrease metoprolol dose ad discontinue enalapril continue lasix  As CXR with vascular congestion  * Schizoaffective disorder- on lithium and abilify, stable  * CKD-monitor, while on lasix and esp with sepsis. Will hold off fluids today as CXR with congestion and some dyspnea on exam  * DVT prophylaxis- on xarelto   Physical therapy consult recommending home health with home PT  All the records are reviewed and case discussed with Care Management/Social Workerr. Management plans discussed with the patient, family and they are in agreement.  CODE STATUS: Full code  TOTAL TIME TAKING CARE OF THIS PATIENT: 38 minutes.   POSSIBLE D/C IN 2 DAYS, DEPENDING ON CLINICAL CONDITION.   Ramonita LabGouru, Kirsten Tran M.D on 07/02/2014 at 12:09 PM  Between 7am to 6pm - Pager - [phone]  After 6pm go to [email or web address] - password [email or web address] [facility]  [facility]  Office  [phone]  CC: Primary care physician; No primary care provider on file.

## 2014-07-02 NOTE — Progress Notes (Signed)
ANTIBIOTIC CONSULT NOTE - INITIAL  Pharmacy Consult for Vancomycin  Indication: Pneumonia  No Known Allergies  Patient Measurements: Height:  (165.1 cm) Weight: 264 lb 14.4 oz (120.158 kg) IBW/kg (Calculated) : 57 Adjusted Body Weight: 82.3kg  Vital Signs: Temp: 99.3 F (37.4 C) (06/05 1200) Temp Source: Oral (06/05 1200) BP: 114/78 mmHg (06/05 1200) Pulse Rate: 83 (06/05 1200) Intake/Output from previous day: 06/04 0701 - 06/05 0700 In: -  Out: 551 [Urine:550; Stool:1] Intake/Output from this shift: Total I/O In: 720 [P.O.:720] Out: 950 [Urine:950]  Labs:  Recent Labs  06/30/14 0335 07/01/14 0606 07/01/14 1514 07/02/14 0616  WBC 12.4* 15.0*  --  10.9  HGB 16.2* 14.4  --  13.7  PLT 176 134*  --  140*  CREATININE 1.29* 1.04* 1.17*  --    Estimated Creatinine Clearance: 48.2 mL/min (by C-G formula based on Cr of 1.17). No results for input(s): VANCOTROUGH, VANCOPEAK, VANCORANDOM, GENTTROUGH, GENTPEAK, GENTRANDOM, TOBRATROUGH, TOBRAPEAK, TOBRARND, AMIKACINPEAK, AMIKACINTROU, AMIKACIN in the last 72 hours.   Microbiology: Recent Results (from the past 720 hour(s))  Culture, blood (routine x 2)     Status: None (Preliminary result)   Collection Time: 06/30/14  3:35 AM  Result Value Ref Range Status   Specimen Description BLOOD  Final   Special Requests LFT HAND  Final   Culture  Setup Time   Final    GRAM NEGATIVE RODS AEROBIC BOTTLE ONLY CRITICAL RESULT CALLED TO, READ BACK BY AND VERIFIED WITH: C/BROOK ROBERTSON 07/01/14 AT 2050 BY HS CONFIRMED BY RW    Culture   Final    GRAM NEGATIVE RODS AEROBIC BOTTLE ONLY IDENTIFICATION TO FOLLOW SUSCEPTIBILITIES TO FOLLOW    Report Status PENDING  Incomplete  Culture, blood (routine x 2)     Status: None (Preliminary result)   Collection Time: 06/30/14  3:37 AM  Result Value Ref Range Status   Specimen Description BLOOD  Final   Special Requests RT HAND  Final   Culture  Setup Time   Final    GRAM NEGATIVE  RODS AEROBIC BOTTLE ONLY CRITICAL RESULT CALLED TO, READ BACK BY AND VERIFIED WITH: RACHEL MCRAE AT 0105 ON 07/01/14 RWW CONFIRMED BY PH    Culture   Final    GRAM NEGATIVE RODS AEROBIC BOTTLE ONLY IDENTIFICATION TO FOLLOW SUSCEPTIBILITIES TO FOLLOW ONCE ISOLATED FOR POSSIBLE MULTIPLE GNR    Report Status PENDING  Incomplete  Urine culture     Status: None (Preliminary result)   Collection Time: 06/30/14  4:19 AM  Result Value Ref Range Status   Specimen Description URINE, CATHETERIZED  Final   Special Requests NONE  Final   Culture   Final    >=100,000 COLONIES/mL GRAM NEGATIVE RODS IDENTIFICATION TO FOLLOW SUSCEPTIBILITIES TO FOLLOW    Report Status PENDING  Incomplete    Medical History: Past Medical History  Diagnosis Date  . Dementia   . Parkinson disease   . Essential hypertension   . Atrial fibrillation, unspecified   . Stroke   . Renal disorder   . Major depression, recurrent, chronic   . Schizoaffective disorder, bipolar type   . Hypercholesteremia   . Mood disorder   . Insomnia   . Renal insufficiency      Goal of Therapy:  Vanc trough 15 to 20  Plan:  Calculations: CrCrl~33.36 (based on ABW), Ke = 0.0.321, T 1/2 = 21.6 hr, Vd = 57.61  Blood and urine cultures pending. Ordered Vancomycin  q24hr. Ordered 1st Vanc trough  6/9 @ 13:30 prior to 4th dose. Pharmacy will continue to follow.  Brittanny Levenhagen,Natha S 07/02/2014,1:29 PM

## 2014-07-03 LAB — COMPREHENSIVE METABOLIC PANEL
ALBUMIN: 3 g/dL — AB (ref 3.5–5.0)
ALK PHOS: 73 U/L (ref 38–126)
ALT: 76 U/L — AB (ref 14–54)
ANION GAP: 7 (ref 5–15)
AST: 29 U/L (ref 15–41)
BUN: 19 mg/dL (ref 6–20)
CHLORIDE: 99 mmol/L — AB (ref 101–111)
CO2: 34 mmol/L — ABNORMAL HIGH (ref 22–32)
Calcium: 9.1 mg/dL (ref 8.9–10.3)
Creatinine, Ser: 0.99 mg/dL (ref 0.44–1.00)
GFR calc Af Amer: 60 mL/min — ABNORMAL LOW (ref >60)
GFR calc non Af Amer: 52 mL/min — ABNORMAL LOW (ref >60)
Glucose, Bld: 155 mg/dL — ABNORMAL HIGH (ref 65–99)
Potassium: 4 mmol/L (ref 3.5–5.1)
Sodium: 140 mmol/L (ref 135–145)
TOTAL PROTEIN: 7 g/dL (ref 6.5–8.1)
Total Bilirubin: 0.8 mg/dL (ref 0.3–1.2)

## 2014-07-03 LAB — URINE CULTURE: Culture: >=100000

## 2014-07-03 LAB — CBC
HCT: 43.5 % (ref 35.0–47.0)
HEMOGLOBIN: 14.3 g/dL (ref 12.0–16.0)
MCH: 32.3 pg (ref 26.0–34.0)
MCHC: 32.9 g/dL (ref 32.0–36.0)
MCV: 98.1 fL (ref 80.0–100.0)
Platelets: 165 10*3/uL (ref 150–440)
RBC: 4.43 MIL/uL (ref 3.80–5.20)
RDW: 13.4 % (ref 11.5–14.5)
WBC: 12.5 10*3/uL — ABNORMAL HIGH (ref 3.6–11.0)

## 2014-07-03 LAB — PROTIME-INR
INR: 1.28
Prothrombin Time: 16.2 seconds — ABNORMAL HIGH (ref 11.4–15.0)

## 2014-07-03 MED ORDER — LEVOFLOXACIN IN D5W 750 MG/150ML IV SOLN
750.0000 mg | Freq: Once | INTRAVENOUS | Status: AC
  Administered 2014-07-03: 750 mg via INTRAVENOUS
  Filled 2014-07-03 (×2): qty 150

## 2014-07-03 MED ORDER — LEVOFLOXACIN IN D5W 750 MG/150ML IV SOLN
750.0000 mg | INTRAVENOUS | Status: DC
  Filled 2014-07-03: qty 150

## 2014-07-03 MED ORDER — VANCOMYCIN HCL 10 G IV SOLR
1250.0000 mg | INTRAVENOUS | Status: DC
  Administered 2014-07-04: 1250 mg via INTRAVENOUS
  Filled 2014-07-03 (×2): qty 1250

## 2014-07-03 MED ORDER — PROMETHAZINE HCL 25 MG/ML IJ SOLN: 12.5000 mg | Freq: Four times a day (QID) | INTRAMUSCULAR | Status: DC | PRN

## 2014-07-03 MED ORDER — RIVAROXABAN 20 MG PO TABS
20.0000 mg | ORAL_TABLET | Freq: Every day | ORAL | Status: DC
  Administered 2014-07-04 – 2014-07-05 (×2): 20 mg via ORAL
  Filled 2014-07-03 (×2): qty 1

## 2014-07-03 MED ORDER — ENALAPRIL MALEATE 5 MG PO TABS
5.0000 mg | ORAL_TABLET | Freq: Every day | ORAL | Status: DC
  Administered 2014-07-03 – 2014-07-05 (×3): 5 mg via ORAL
  Filled 2014-07-03 (×3): qty 1

## 2014-07-03 NOTE — Progress Notes (Signed)
ANTIBIOTIC CONSULT NOTE - INITIAL  Pharmacy Consult for Levaquin/Vancomycin Indication: pneumonia  No Known Allergies  Patient Measurements: Height:  (165.1 cm) Weight: 265 lb 9.6 oz (120.475 kg) IBW/kg (Calculated) : 57 Adjusted Body Weight: 82.4 kg  Vital Signs: Temp: 98.8 F (37.1 C) (06/06 1116) Temp Source: Oral (06/06 1116) BP: 127/77 mmHg (06/06 1116) Pulse Rate: 59 (06/06 1116) Intake/Output from previous day: 06/05 0701 - 06/06 0700 In: 2220 [P.O.:1920; IV Piggyback:300] Out: 1450 [Urine:1450] Intake/Output from this shift: Total I/O In: 240 [P.O.:240] Out: -   Labs:  Recent Labs  07/01/14 0606 07/01/14 1514 07/02/14 0616 07/03/14 0520  WBC 15.0*  --  10.9 12.5*  HGB 14.4  --  13.7 14.3  PLT 134*  --  140* 165  CREATININE 1.04* 1.17*  --  0.99   Estimated Creatinine Clearance: 57 mL/min (by C-G formula based on Cr of 0.99).   Microbiology: Recent Results (from the past 720 hour(s))  Culture, blood (routine x 2)     Status: None (Preliminary result)   Collection Time: 06/30/14  3:35 AM  Result Value Ref Range Status   Specimen Description BLOOD  Final   Special Requests LFT HAND  Final   Culture  Setup Time   Final    GRAM NEGATIVE RODS AEROBIC BOTTLE ONLY CRITICAL RESULT CALLED TO, READ BACK BY AND VERIFIED WITH: C/BROOK ROBERTSON 07/01/14 AT 2050 BY HS CONFIRMED BY RW    Culture ESCHERICHIA COLI AEROBIC BOTTLE ONLY   Final   Report Status PENDING  Incomplete   Organism ID, Bacteria ESCHERICHIA COLI  Final      Susceptibility   Escherichia coli - MIC*    AMPICILLIN 16 INTERMEDIATE Intermediate     CEFTAZIDIME <=1 SENSITIVE Sensitive     CEFAZOLIN <=4 SENSITIVE Sensitive     CEFTRIAXONE <=1 SENSITIVE Sensitive     CIPROFLOXACIN <=0.25 SENSITIVE Sensitive     GENTAMICIN <=1 SENSITIVE Sensitive     IMIPENEM <=0.25 SENSITIVE Sensitive     TRIMETH/SULFA <=20 SENSITIVE Sensitive     CEFOXITIN 16 RESISTANT Resistant     * ESCHERICHIA COLI   Culture, blood (routine x 2)     Status: None (Preliminary result)   Collection Time: 06/30/14  3:37 AM  Result Value Ref Range Status   Specimen Description BLOOD  Final   Special Requests RT HAND  Final   Culture  Setup Time   Final    GRAM NEGATIVE RODS AEROBIC BOTTLE ONLY CRITICAL RESULT CALLED TO, READ BACK BY AND VERIFIED WITH: RACHEL MCRAE AT 0105 ON 07/01/14 RWW CONFIRMED BY PH    Culture   Final    GRAM NEGATIVE RODS AEROBIC BOTTLE ONLY IDENTIFICATION TO FOLLOW SUSCEPTIBILITIES TO FOLLOW ONCE ISOLATED FOR POSSIBLE MULTIPLE GNR    Report Status PENDING  Incomplete  Urine culture     Status: None   Collection Time: 06/30/14  4:19 AM  Result Value Ref Range Status   Specimen Description URINE, CATHETERIZED  Final   Special Requests NONE  Final   Culture >=100,000 COLONIES/mL ESCHERICHIA COLI  Final   Report Status 07/03/2014 FINAL  Final   Organism ID, Bacteria ESCHERICHIA COLI  Final      Susceptibility   Escherichia coli - MIC*    AMPICILLIN <=2 SENSITIVE Sensitive     CEFTAZIDIME <=1 SENSITIVE Sensitive     CEFAZOLIN <=4 SENSITIVE Sensitive     CEFTRIAXONE <=1 SENSITIVE Sensitive     CIPROFLOXACIN <=0.25 SENSITIVE Sensitive  GENTAMICIN <=1 SENSITIVE Sensitive     IMIPENEM <=0.25 SENSITIVE Sensitive     TRIMETH/SULFA <=20 SENSITIVE Sensitive     CEFOXITIN <=4 SENSITIVE Sensitive     NITROFURANTOIN Value in next row Sensitive      SENSITIVE<=16    * >=100,000 COLONIES/mL ESCHERICHIA COLI    Medical History: Past Medical History  Diagnosis Date  . Dementia   . Parkinson disease   . Essential hypertension   . Atrial fibrillation, unspecified   . Stroke   . Renal disorder   . Major depression, recurrent, chronic   . Schizoaffective disorder, bipolar type   . Hypercholesteremia   . Mood disorder   . Insomnia   . Renal insufficiency     Medications:  Scheduled:  . acidophilus  2 capsule Oral Daily  . ARIPiprazole  2 mg Oral Daily  . brimonidine  1  drop Both Eyes Daily   And  . timolol  1 drop Both Eyes Daily  . calcium-vitamin D  1 tablet Oral BID  . enalapril  5 mg Oral Daily  . furosemide  20 mg Oral Daily  . hydrocortisone   Topical BID  . latanoprost  1 drop Both Eyes QHS  . levofloxacin (LEVAQUIN) IV  750 mg Intravenous Once  . [START ON 07/04/2014] levofloxacin (LEVAQUIN) IV  750 mg Intravenous Q24H  . levothyroxine  25 mcg Oral QAC breakfast  . lithium citrate  150 mg Oral Daily  . liver oil-zinc oxide   Topical TID  . metoprolol  25 mg Oral BID  . multivitamin with minerals  1 tablet Oral Daily  . nystatin   Topical TID  . omega-3 acid ethyl esters  2 capsule Oral BID  . polyethylene glycol  17 g Oral Daily  . pravastatin  20 mg Oral Daily  . Rivaroxaban  15 mg Oral Daily  . vancomycin  1,250 mg Intravenous Q24H   Assessment: Patient is an 81 yo female admitted for sepsis.  MD desires treatment for pneumonia with IV Vancomycin and Levaquin.  Blood and urine cultures growing pan-sensitive E. Coli  Est CrCl~ 57 mL/min, ke: 0.052, t1/2: 13.3 h, Vd: 58 L Goal of Therapy:  Vancomycin trough level 15-20 mcg/ml  Plan:  Will transition patient to Vancomycin 1250 mg IV q18h dosing based on improvement in renal function from initial calculations. Will draw trough prior to 4th dose on 6/8 at 2030.  Will start patient on Levaquin 750 mg IV q24h based on renal function. Measure antibiotic drug levels at steady state Follow up culture results  Pharmacy will continue to follow.  Clarisa Schoolsrystal Dailey Buccheri, PharmD Clinical Pharmacist 07/03/2014

## 2014-07-03 NOTE — Progress Notes (Signed)
[facility] - [place] at [facility]   PATIENT NAME: Kirsten Tran    MR#:  [id number]  DATE OF BIRTH:  Sep 04, 1930  SUBJECTIVE:  CHIEF COMPLAINT:   Chief Complaint  Patient presents with  . Code Sepsis    ams vomiting afib rvr fever tachyapnea   -Admitted with fever and confusion.Noted to be septic. - acute cystitis and also cholecystitis - denies any nausea, vomiting or abdominal pain now - Today patient is resting comfortably. Denies any shortness of breath REVIEW OF SYSTEMS:  ROS  Constitutional: Denies any fever or chills today Respiratory: Negative for cough, shortness of breath and wheezing.  Cardiovascular: Negative for chest pain and palpitations.  Gastrointestinal:  Negative for diarrhea and constipation.  Genitourinary: Negative for dysuria.  Neurological: Negative for dizziness, seizures and headaches.  DRUG ALLERGIES:  No Known Allergies  VITALS:  Blood pressure 127/77, pulse 59, temperature 98.8 F (37.1 C), temperature source Oral, resp. rate 20, height  (1.651 m), weight 120.475 kg (265 lb 9.6 oz), SpO2 98 %.  PHYSICAL EXAMINATION:  Physical Exam  GENERAL:  81 y.o.-year-old patient lying in the bed with no acute distress.  EYES: Pupils equal, round, reactive to light and accommodation. No scleral icterus. Extraocular muscles intact.  HEENT: Head atraumatic, normocephalic. Oropharynx and nasopharynx clear.  NECK:  Supple, no jugular venous distention. No thyroid enlargement, no tenderness.  LUNGS:  decreased at the bases, diffuse wheezing, positive rales and crepitation. No use of accessory muscles of respiration. Appears dyspneic. CARDIOVASCULAR: S1, S2 normal. No murmurs, rubs, or gallops.  ABDOMEN: Soft,  nontender, nondistended. Bowel sounds present. No organomegaly or mass.  No right upper quadrant tenderness. EXTREMITIES: No pedal edema, cyanosis, or clubbing.  NEUROLOGIC: Cranial nerves II through XII are intact.  Muscle strength 5/5 in all extremities. Sensation intact. Gait not checked.  PSYCHIATRIC: The patient is alert and oriented x 2-3.  SKIN: No obvious rash, lesion, or ulcer.    LABORATORY PANEL:   CBC  Recent Labs Lab 07/03/14 0520  WBC 12.5*  HGB 14.3  HCT 43.5  PLT 165   ------------------------------------------------------------------------------------------------------------------  Chemistries   Recent Labs Lab 07/03/14 0520  NA 140  K 4.0  CL 99*  CO2 34*  GLUCOSE 155*  BUN 19  CREATININE 0.99  CALCIUM 9.1  AST 29  ALT 76*  ALKPHOS 73  BILITOT 0.8   ------------------------------------------------------------------------------------------------------------------  Cardiac Enzymes  Recent Labs Lab 06/30/14 0335  TROPONINI 0.06*   ------------------------------------------------------------------------------------------------------------------  RADIOLOGY:  Dg Chest 2 View  07/02/2014   CLINICAL DATA:  Cough. Shortness of breath. Atrial fibrillation. Urosepsis.  EXAM: CHEST  2 VIEW  COMPARISON:  06/30/2014  FINDINGS: Mild cardiomegaly stable.  No evidence of congestive heart failure.  Increased opacity is noted in the left lung base which may be due to atelectasis or infiltrate. Right lung remains clear. No evidence of pleural effusion.  IMPRESSION: Increased left basilar atelectasis versus infiltrate.  Stable mild cardiomegaly.   Electronically Signed   By: Myles Rosenthal M.D.   On: 07/02/2014 11:42   Ct Abdomen Pelvis W Contrast  07/01/2014   CLINICAL DATA:  Nausea and vomiting for 2 days, history Parkinson's disease, dementia, hypertension, stroke, atrial fibrillation  EXAM: CT ABDOMEN AND PELVIS WITH CONTRAST  TECHNIQUE: Multidetector CT imaging of the abdomen and pelvis was performed using the standard protocol following bolus administration of intravenous contrast. Sagittal and coronal MPR images reconstructed from axial data set.  CONTRAST:  80mL OMNIPAQUE  IOHEXOL  300 MG/ML SOLN IV. Dilute oral contrast.  COMPARISON:  None  FINDINGS: Bibasilar atelectasis.  RIGHT renal cyst 3.0 x 2.5 cm.  Age-related renal cortical atrophy.  Multiple gallstones in gallbladder, which appears contracted.  Nonspecific 10 mm low-attenuation focus posterior RIGHT lobe liver image 16.  Liver, spleen, pancreas, kidneys and adrenal glands otherwise normal appearance.  Mild descending and sigmoid colonic diverticulosis without evidence of diverticulitis.  Stomach and bowel loops otherwise normal appearance.  Uterus surgically absent with nonvisualization of ovaries and appendix.  Bladder decompressed by Foley catheter with unremarkable ureters.  Scattered atherosclerotic calcifications.  No mass, adenopathy, free fluid, free air, or inflammatory process.  Suspect paraesophageal herniation of fat into the inferior mediastinum.  Mitral annular and question aortic valvular calcifications noted.  Bones demineralized with degenerative disc and facet disease changes of the lumbar spine associated with biconvex scoliosis.  IMPRESSION: Contracted gallbladder containing multiple gallstones.  Atrophic kidneys with small RIGHT renal cyst.  Distal colonic diverticulosis without evidence of diverticulitis.  No definite acute intra-abdominal or intrapelvic process identify.   Electronically Signed   By: Ulyses SouthwardMark  Boles M.D.   On: 07/01/2014 19:58    EKG:   Orders placed or performed during the hospital encounter of 06/30/14  . EKG 12-Lead  . EKG 12-Lead    ASSESSMENT AND PLAN:   84y/oM with PMH of Dementia, Parkinsons disease, HTN, Afib, CVA, CKD, Depression, schizoaffective disorder admitted for sepsis  * Sepsis- likely source UTI with hematuria , healthcare associated pneumonia on the left lower lobe and acute cholecystitis - Blood cultures with gram-negative rods and urine cultures with Escherichia coli which is pansensitive- will discontinue empiric invanz . We will add levofloxacin and  vancomycin for  pneumonia. Incentive spirometry - IV fluids-discontinued -Hematuria is improving, no gross hematuria in the urine bag  * Acute cholecystitis-incidental finding on US abd - - appreciate surgical consult- CAT scan of the abdomen and pelvis has revealed contracted gallbladder with multiple gallstones, discussed with Dr. Michela PitcherEly. Not considering percutaneous stent as gallbladder is contracted-Patient is not a surgical candidate. Patient is asymptomatic  - We'll advance diet as tolerated -  * Afib- rate controlled, on metoprolol On xarelto. Cont anticoagulation - as h/o CVA- will need bridging if needs surgery Stable for now  * HTN-monitor BP, with sepsis Currently on metoprolol, enalapril and lasix-continue lasix  As CXR with vascular congestion  * Schizoaffective disorder- on lithium and abilify, stable  * CKD-monitor, while on lasix and esp with sepsis. Will hold off fluids today as CXR with congestion and some dyspnea on exam  * DVT prophylaxis- on xarelto   Physical therapy consult recommending home health with home PT  All the records are reviewed and case discussed with Care Management/Social Workerr. Management plans discussed with the patient, family and they are in agreement.  CODE STATUS: Full code  TOTAL TIME TAKING CARE OF THIS PATIENT: 38 minutes.   POSSIBLE D/C IN 2 DAYS, DEPENDING ON CLINICAL CONDITION.   Ramonita LabGouru, Zyra Parrillo M.D on 07/03/2014 at 3:09 PM  Between 7am to 6pm - Pager - [phone]  After 6pm go to [email or web address] - password [email or web address] [facility]  [facility]  Office  [phone]  CC: Primary care physician; No primary care provider on file.

## 2014-07-03 NOTE — Care Management (Signed)
Made several attempts to speak with patient but she was too nauseated to participate in discussion. It has been confirmed that patient is from [facility] (skilled nursing)  and not [facility].

## 2014-07-03 NOTE — Clinical Social Work Note (Signed)
Clinical Social Work Assessment  Patient Details  Name: Kirsten Tran MRN: [id number] Date of Birth: Mar 30, 1930  Date of referral:  07/03/14               Reason for consult:  Facility Placement, Other (Comment Required) (pt is from [facility])                Permission sought to share information with:  Facility Medical sales representativeContact Representative, Family Supports Permission granted to share information::  Yes, Verbal Permission Granted  Name::        Agency::     Relationship::     Contact Information:     Housing/Transportation Living arrangements for the past 2 months:  Skilled Building surveyorursing Facility Source of Information:  Patient, Facility, Adult Children Patient Interpreter Needed:  None Criminal Activity/Legal Involvement Pertinent to Current Situation/Hospitalization:  No - Comment as needed Significant Relationships:  Adult Children Lives with:    Do you feel safe going back to the place where you live?  Yes Need for family participation in patient care:  Yes (Comment)  Care giving concerns:  Pt and pt's family are in agreement with pt going back to [facility].   Social Worker assessment / plan:  CSW spoke to pt.  She was able to answer orientation questions but with difficulty. CSW spoke to pt's daughter Clydie BraunKaren, she is in agreement with pt returning to [facility] once she is medically stable.    Employment status:  Retired Health and safety inspectornsurance information:  Medicare PT Recommendations:  Home with Home Health Information / Referral to community resources:     Patient/Family's Response to care:  Pt and pt's daughter are in agreement with DC to SNF.  Both thanked CSW for speaking with them.  Patient/Family's Understanding of and Emotional Response to Diagnosis, Current Treatment, and Prognosis:  Pt and pt's daughter are in agreement with DC back to [facility].    Emotional Assessment Appearance:  Appears stated  age Attitude/Demeanor/Rapport:   (pt was confused but polite) Affect (typically observed):   (confused, and tired.) Orientation:  Oriented to Self, Oriented to Place, Fluctuating Orientation (Suspected and/or reported Sundowners) Alcohol / Substance use:  Never Used Psych involvement (Current and /or in the community):  No (Comment)  Discharge Needs  Concerns to be addressed:    Readmission within the last 30 days:  No Current discharge risk:  Physical Impairment Barriers to Discharge:      Chauncy PassyBennerson, Rileyann Florance J, LCSW 07/03/2014, 3:02 PM

## 2014-07-03 NOTE — Progress Notes (Signed)
Speech Language Pathology Treatment: Dysphagia  Patient Details Name: Kirsten Tran MRN: [id number] DOB: 13-Dec-1930 Today's Date: 07/03/2014 Time: [id number] SLP Time Calculation (min) (ACUTE ONLY): 35 min  Assessment / Plan / Recommendation Clinical Impression  Pt appears to be tolerating her current diet of Clear liquids (per GI rec. At this time) w/ no overt s/s of aspiration noted by pt/NSG. Pt has been unable to consistently consume much liquid at her meals sec. To N/V feelings; NSG aware. Pt is able to feed self and follows general aspiration precautions of using small, single sips slowly when drinking/eating given only min. Verbal cues. NSG has reported no difficulty when monitored w/ po's. Pt appears at reduced risk for aspiration when following general aspiration precautions. ST will continue to f/u w/ toleration of diet as GI advances the consistency. Encouraged pt to her dentures b/t meals now to get use to the feeling of them again. Pt agreed.     HPI Other Pertinent Information: pt reported intermittent episodes of N/V w/ po's but stated she only had "a little last time". Pt was not clear as to an exact time frame. NSG aware.    Pertinent Vitals Pain Assessment: No/denies pain  SLP Plan  Continue with current plan of care    Recommendations Diet recommendations: Thin liquid (clear liquid diet per MD) Liquids provided via: Cup;Straw Medication Administration: Whole meds with puree Supervision: Patient able to self feed (setup at meals) Compensations: Slow rate;Small sips/bites Postural Changes and/or Swallow Maneuvers: Seated upright 90 degrees              General recommendations:  (Dietician) Oral Care Recommendations: Oral care BID;Oral care before and after PO;Staff/trained caregiver to provide oral care Follow up Recommendations:  (TBD) Plan: Continue with current plan of care    GO     Watson,Katherine 07/03/2014, 3:26 PM

## 2014-07-04 LAB — CREATININE, SERUM
CREATININE: 0.94 mg/dL (ref 0.44–1.00)
GFR calc Af Amer: >60 mL/min (ref >60)
GFR calc non Af Amer: 55 mL/min — ABNORMAL LOW (ref >60)

## 2014-07-04 MED ORDER — LEVOFLOXACIN 750 MG PO TABS
750.0000 mg | ORAL_TABLET | Freq: Every day | ORAL | Status: DC
  Administered 2014-07-04 – 2014-07-05 (×2): 750 mg via ORAL
  Filled 2014-07-04 (×2): qty 1

## 2014-07-04 NOTE — Progress Notes (Signed)
[facility]   PATIENT NAME: Kirsten Tran    MR#:  [id number]  DATE OF BIRTH:  [birth year]  SUBJECTIVE:  CHIEF COMPLAINT:   Chief Complaint  Patient presents with  . Code Sepsis    ams vomiting afib rvr fever tachyapnea   -Admitted with fever and confusion.Noted to be septic. - acute cystitis and also cholecystitis - denies any nausea, vomiting or abdominal pain now - Today patient with no new complaints. Tolerating liquid diet. Denies nausea REVIEW OF SYSTEMS:  ROS  Constitutional: Denies any fever or chills today Respiratory: Negative for cough, shortness of breath and wheezing.  Cardiovascular: Negative for chest pain and palpitations.  Gastrointestinal:  Negative for diarrhea and constipation.  Genitourinary: Negative for dysuria.  Neurological: Negative for dizziness, seizures and headaches.  DRUG ALLERGIES:  No Known Allergies  VITALS:  Blood pressure 126/72, pulse 103, temperature 98.2 F (36.8 C), temperature source Oral, resp. rate 23, height  (1.651 m), weight 119.5 kg (263 lb 7.2 oz), SpO2 99 %.  PHYSICAL EXAMINATION:  Physical Exam  GENERAL:  81 y.o.-year-old patient lying in the bed with no acute distress.  EYES: Pupils equal, round, reactive to light and accommodation. No scleral icterus. Extraocular muscles intact.  HEENT: Head atraumatic, normocephalic. Oropharynx and nasopharynx clear.  NECK:  Supple, no jugular venous distention. No thyroid enlargement, no tenderness.  LUNGS:  decreased at the bases, diffuse wheezing, positive rales and crepitation. No use of accessory muscles of respiration. Appears dyspneic. CARDIOVASCULAR: S1, S2 normal. No murmurs, rubs, or gallops.  ABDOMEN: Soft,  nontender, nondistended. Bowel sounds present. No organomegaly or mass.  No right upper quadrant tenderness. EXTREMITIES: No pedal edema, cyanosis, or clubbing.  NEUROLOGIC: Cranial nerves II through XII are  intact. Muscle strength 5/5 in all extremities. Sensation intact. Gait not checked.  PSYCHIATRIC: The patient is alert and oriented x 2-3.  SKIN: No obvious rash, lesion, or ulcer.    LABORATORY PANEL:   CBC  Recent Labs Lab 07/03/14 0520  WBC 12.5*  HGB 14.3  HCT 43.5  PLT 165   ------------------------------------------------------------------------------------------------------------------  Chemistries   Recent Labs Lab 07/03/14 0520 07/04/14 0511  NA 140  --   K 4.0  --   CL 99*  --   CO2 34*  --   GLUCOSE 155*  --   BUN 19  --   CREATININE 0.99 0.94  CALCIUM 9.1  --   AST 29  --   ALT 76*  --   ALKPHOS 73  --   BILITOT 0.8  --    ------------------------------------------------------------------------------------------------------------------  Cardiac Enzymes  Recent Labs Lab 06/30/14 0335  TROPONINI 0.06*   ------------------------------------------------------------------------------------------------------------------  RADIOLOGY:  No results found.  EKG:   Orders placed or performed during the hospital encounter of 06/30/14  . EKG 12-Lead  . EKG 12-Lead    ASSESSMENT AND PLAN:   84y/oM with PMH of Dementia, Parkinsons disease, HTN, Afib, CVA, CKD, Depression, schizoaffective disorder admitted for sepsis  * Sepsis- likely source UTI with hematuria , healthcare associated pneumonia on the left lower lobe and acute cholecystitis - Blood cultures and urine cultures with Escherichia coli which is pansensitive- will discontinue empiric invanz . We will continue levofloxacin and discontinue vancomycin . Incentive spirometry - IV fluids-discontinued -Hematuria is improving, no gross hematuria in the urine bag  * Acute cholecystitis-incidental finding on [place] abd - - appreciate surgical consult- CAT scan of the abdomen and pelvis has revealed contracted  gallbladder with multiple gallstones, discussed with Dr. Michela PitcherEly. Not considering percutaneous stent as  gallbladder is contracted-Patient is not a surgical candidate. Patient is asymptomatic  - We'll advance diet as tolerated -  * Afib- rate controlled, on metoprolol On xarelto. Cont anticoagulation - as h/o CVA- will need bridging if needs surgery Stable for now  * HTN-monitor BP, with sepsis Currently on metoprolol, enalapril and lasix-continue lasix  As CXR with vascular congestion  * Schizoaffective disorder- on lithium and abilify, stable  * CKD??-monitor, dinner function is in the normal range with normal BUN and creatinine. GFR is at 60.    * DVT prophylaxis- on xarelto   Physical therapy consult recommending home health with home PT  All the records are reviewed and case discussed with Care Management/Social Workerr. Management plans discussed with the patient, family and they are in agreement.  CODE STATUS: Full code  TOTAL TIME TAKING CARE OF THIS PATIENT: 35 minutes.   POSSIBLE D/C IN A.M. DAYS, DEPENDING ON CLINICAL CONDITION.   Ramonita LabGouru, Burnett Spray M.D on 07/04/2014 at 4:44 PM  Between 7am to 6pm - Pager - [phone]  After 6pm go to [email or web address] - password [email or web address] [facility]  [facility]  Office  [phone]  CC: Primary care physician; No primary care provider on file.

## 2014-07-04 NOTE — Progress Notes (Signed)
ANTIBIOTIC CONSULT NOTE - Follow Up  Pharmacy Consult for Levaquin Indication: pneumonia  No Known Allergies  Patient Measurements: Height:  (165.1 cm) Weight: 263 lb 7.2 oz (119.5 kg) IBW/kg (Calculated) : 57 Adjusted Body Weight: 82.4 kg  Vital Signs: Temp: 98.2 F (36.8 C) (06/07 1119) Temp Source: Oral (06/07 1119) BP: 126/72 mmHg (06/07 1119) Pulse Rate: 103 (06/07 1119) Intake/Output from previous day: 06/06 0701 - 06/07 0700 In: 480 [P.O.:480] Out: 2075 [Urine:2075] Intake/Output from this shift: Total I/O In: 720 [P.O.:720] Out: 250 [Urine:250]  Labs:  Recent Labs  07/02/14 0616 07/03/14 0520 07/04/14 0511  WBC 10.9 12.5*  --   HGB 13.7 14.3  --   PLT 140* 165  --   CREATININE  --  0.99 0.94   Estimated Creatinine Clearance: 59.7 mL/min (by C-G formula based on Cr of 0.94).   Microbiology: Recent Results (from the past 720 hour(s))  Culture, blood (routine x 2)     Status: None (Preliminary result)   Collection Time: 06/30/14  3:35 AM  Result Value Ref Range Status   Specimen Description BLOOD  Final   Special Requests LFT HAND  Final   Culture  Setup Time   Final    GRAM NEGATIVE RODS AEROBIC BOTTLE ONLY CRITICAL RESULT CALLED TO, READ BACK BY AND VERIFIED WITH: C/BROOK ROBERTSON 07/01/14 AT 2050 BY HS CONFIRMED BY RW    Culture ESCHERICHIA COLI AEROBIC BOTTLE ONLY   Final   Report Status PENDING  Incomplete   Organism ID, Bacteria ESCHERICHIA COLI  Final      Susceptibility   Escherichia coli - MIC*    AMPICILLIN 16 INTERMEDIATE Intermediate     CEFTAZIDIME <=1 SENSITIVE Sensitive     CEFAZOLIN <=4 SENSITIVE Sensitive     CEFTRIAXONE <=1 SENSITIVE Sensitive     CIPROFLOXACIN <=0.25 SENSITIVE Sensitive     GENTAMICIN <=1 SENSITIVE Sensitive     IMIPENEM <=0.25 SENSITIVE Sensitive     TRIMETH/SULFA <=20 SENSITIVE Sensitive     CEFOXITIN 16 RESISTANT Resistant     * ESCHERICHIA COLI  Culture, blood (routine x 2)     Status: None  (Preliminary result)   Collection Time: 06/30/14  3:37 AM  Result Value Ref Range Status   Specimen Description BLOOD  Final   Special Requests RT HAND  Final   Culture  Setup Time   Final    GRAM NEGATIVE RODS AEROBIC BOTTLE ONLY CRITICAL RESULT CALLED TO, READ BACK BY AND VERIFIED WITH: RACHEL MCRAE AT 0105 ON 07/01/14 RWW CONFIRMED BY PH    Culture   Final    ESCHERICHIA COLI AEROBIC BOTTLE ONLY SUSCEPTIBILITIES TO FOLLOW    Report Status PENDING  Incomplete  Urine culture     Status: None   Collection Time: 06/30/14  4:19 AM  Result Value Ref Range Status   Specimen Description URINE, CATHETERIZED  Final   Special Requests NONE  Final   Culture >=100,000 COLONIES/mL ESCHERICHIA COLI  Final   Report Status 07/03/2014 FINAL  Final   Organism ID, Bacteria ESCHERICHIA COLI  Final      Susceptibility   Escherichia coli - MIC*    AMPICILLIN <=2 SENSITIVE Sensitive     CEFTAZIDIME <=1 SENSITIVE Sensitive     CEFAZOLIN <=4 SENSITIVE Sensitive     CEFTRIAXONE <=1 SENSITIVE Sensitive     CIPROFLOXACIN <=0.25 SENSITIVE Sensitive     GENTAMICIN <=1 SENSITIVE Sensitive     IMIPENEM <=0.25 SENSITIVE Sensitive  TRIMETH/SULFA <=20 SENSITIVE Sensitive     CEFOXITIN <=4 SENSITIVE Sensitive     NITROFURANTOIN Value in next row Sensitive      SENSITIVE<=16    * >=100,000 COLONIES/mL ESCHERICHIA COLI    Medical History: Past Medical History  Diagnosis Date  . Dementia   . Parkinson disease   . Essential hypertension   . Atrial fibrillation, unspecified   . Stroke   . Renal disorder   . Major depression, recurrent, chronic   . Schizoaffective disorder, bipolar type   . Hypercholesteremia   . Mood disorder   . Insomnia   . Renal insufficiency     Medications:  Scheduled:  . acidophilus  2 capsule Oral Daily  . ARIPiprazole  2 mg Oral Daily  . brimonidine  1 drop Both Eyes Daily   And  . timolol  1 drop Both Eyes Daily  . calcium-vitamin D  1 tablet Oral BID  .  enalapril  5 mg Oral Daily  . furosemide  20 mg Oral Daily  . hydrocortisone   Topical BID  . latanoprost  1 drop Both Eyes QHS  . levofloxacin  750 mg Oral Daily  . levothyroxine  25 mcg Oral QAC breakfast  . lithium citrate  150 mg Oral Daily  . liver oil-zinc oxide   Topical TID  . metoprolol  25 mg Oral BID  . multivitamin with minerals  1 tablet Oral Daily  . nystatin   Topical TID  . omega-3 acid ethyl esters  2 capsule Oral BID  . polyethylene glycol  17 g Oral Daily  . pravastatin  20 mg Oral Daily  . rivaroxaban  20 mg Oral Daily   Assessment: Patient is an 81 yo female admitted for sepsis.  MD desires treatment for pneumonia on IV Vancomycin and Levaquin.  Blood and urine cultures growing pan-sensitive E. Coli.  Vancomycin discontinued today.  Goal of Therapy:  Eradication of infection   Plan:  Per MD request, will transition patient on Levaquin 750 mg IV q24h to Levaquin 750mg  PO q24h.   Kirsten Tran, PharmD Clinical Pharmacist 07/04/2014

## 2014-07-04 NOTE — Clinical Documentation Improvement (Signed)
Hx Chronic Kidney Disease mentioned throughout medical record.  . Please document the stage of CKD if possible:  Chronic kidney disease, stage 1- GFR > OR = 90 Chronic kidney disease, stage 2 (mild) - GFR 60-89 Chronic kidney disease, stage 3 (moderate) - GFR 30-59 Chronic kidney disease, stage 4 (severe) - GFR 15-29 Chronic kidney disease, stage 5- GFR < 15 End-stage renal disease (ESRD) Unable to clinically determine  Thank you,  Sharyn Creameronna Nyajah Hyson, BSN, RN [place] HIM/Clinical Documentation Specialist [email or web address] [phone]

## 2014-07-04 NOTE — Progress Notes (Signed)
Physical Therapy Treatment Patient Details Name: Kirsten ForwardMarjorie J Tran MRN: [id number] DOB: 13-Jan-1931 Today's Date: 07/04/2014    History of Present Illness The patient presents emergency department via EMS from her nursing home where she was apparently vomiting and febrile. Patient was found to be tachycardic upon admission; she was admitted with UTI/possible sepsis; Patient has a PMH significant for dementia/parkinson's disease and schizoaffective disorder; She [facility] living at [facility] prior to admittance    PT Comments    Pt stating feeling "tired" today but willing to participate in therapy.  Performed supine therex, min A supine to sit at EOB, pt having period of incontinence requiring break from therapy.  Pt would continue to benefit from skilled PT to improve endurance, strength, and functional mobility tolerance.    Follow Up Recommendations  Home health PT     Equipment Recommendations       Recommendations for Other Services       Precautions / Restrictions Precautions Precautions: Fall Restrictions Weight Bearing Restrictions: No    Mobility  Bed Mobility Overal bed mobility: Needs Assistance Bed Mobility: Rolling;Sidelying to Sit     Supine to sit: Min assist     General bed mobility comments: Pt requiring cues for sequencing and hand placement supine to sit  Transfers                    Ambulation/Gait                 Stairs            Wheelchair Mobility    Modified Rankin (Stroke Patients Only)       Balance                                    Cognition Arousal/Alertness: Awake/alert Behavior During Therapy: WFL for tasks assessed/performed Overall Cognitive Status: Within Functional Limits for tasks assessed                      Exercises Other Exercises Other Exercises: performed supine LE therex, a/p, heel slides, hip abd, SLR,     General Comments        Pertinent  Vitals/Pain Pain Assessment: No/denies pain    Home Living                      Prior Function            PT Goals (current goals can now be found in the care plan section) Acute Rehab PT Goals Patient Stated Goal: to go back to [facility] PT Goal Formulation: With patient Time For Goal Achievement: 07/15/14 Potential to Achieve Goals: Good    Frequency  Min 2X/week    PT Plan Current plan remains appropriate    Co-evaluation             End of Session   Activity Tolerance: Patient limited by fatigue;Patient tolerated treatment well Patient left: in bed;with call bell/phone within reach;with bed alarm set     Time: [id number] PT Time Calculation (min) (ACUTE ONLY): 24 min  Charges:  $Therapeutic Exercise: 8-22 mins $Therapeutic Activity: 8-22 mins                    G Codes:      Esbeydi Manago 07/04/2014, 3:31 PM Delron Comer, PTA

## 2014-07-05 DIAGNOSIS — F0280 Dementia in other diseases classified elsewhere without behavioral disturbance: Secondary | ICD-10-CM | POA: Diagnosis not present

## 2014-07-05 DIAGNOSIS — Z23 Encounter for immunization: Secondary | ICD-10-CM | POA: Diagnosis not present

## 2014-07-05 DIAGNOSIS — F39 Unspecified mood [affective] disorder: Secondary | ICD-10-CM | POA: Diagnosis not present

## 2014-07-05 DIAGNOSIS — I6789 Other cerebrovascular disease: Secondary | ICD-10-CM | POA: Diagnosis not present

## 2014-07-05 DIAGNOSIS — E780 Pure hypercholesterolemia: Secondary | ICD-10-CM | POA: Diagnosis not present

## 2014-07-05 DIAGNOSIS — F332 Major depressive disorder, recurrent severe without psychotic features: Secondary | ICD-10-CM | POA: Diagnosis not present

## 2014-07-05 DIAGNOSIS — G20 Parkinson's disease: Secondary | ICD-10-CM | POA: Diagnosis not present

## 2014-07-05 DIAGNOSIS — A419 Sepsis, unspecified organism: Secondary | ICD-10-CM | POA: Diagnosis not present

## 2014-07-05 DIAGNOSIS — I10 Essential (primary) hypertension: Secondary | ICD-10-CM | POA: Diagnosis not present

## 2014-07-05 DIAGNOSIS — F329 Major depressive disorder, single episode, unspecified: Secondary | ICD-10-CM | POA: Diagnosis not present

## 2014-07-05 DIAGNOSIS — K819 Cholecystitis, unspecified: Secondary | ICD-10-CM | POA: Diagnosis not present

## 2014-07-05 DIAGNOSIS — N390 Urinary tract infection, site not specified: Secondary | ICD-10-CM | POA: Diagnosis not present

## 2014-07-05 DIAGNOSIS — H409 Unspecified glaucoma: Secondary | ICD-10-CM | POA: Diagnosis not present

## 2014-07-05 DIAGNOSIS — N3000 Acute cystitis without hematuria: Secondary | ICD-10-CM | POA: Diagnosis not present

## 2014-07-05 DIAGNOSIS — R531 Weakness: Secondary | ICD-10-CM | POA: Diagnosis not present

## 2014-07-05 DIAGNOSIS — I4891 Unspecified atrial fibrillation: Secondary | ICD-10-CM | POA: Diagnosis not present

## 2014-07-05 DIAGNOSIS — N289 Disorder of kidney and ureter, unspecified: Secondary | ICD-10-CM | POA: Diagnosis not present

## 2014-07-05 DIAGNOSIS — G4700 Insomnia, unspecified: Secondary | ICD-10-CM | POA: Diagnosis not present

## 2014-07-05 DIAGNOSIS — E119 Type 2 diabetes mellitus without complications: Secondary | ICD-10-CM | POA: Diagnosis not present

## 2014-07-05 DIAGNOSIS — E039 Hypothyroidism, unspecified: Secondary | ICD-10-CM | POA: Diagnosis not present

## 2014-07-05 DIAGNOSIS — F250 Schizoaffective disorder, bipolar type: Secondary | ICD-10-CM | POA: Diagnosis not present

## 2014-07-05 LAB — CULTURE, BLOOD (ROUTINE X 2)

## 2014-07-05 MED ORDER — LEVOFLOXACIN 750 MG PO TABS: 750.0000 mg | ORAL_TABLET | Freq: Every day | ORAL | Status: AC

## 2014-07-05 MED ORDER — METOPROLOL TARTRATE 25 MG PO TABS: 25.0000 mg | ORAL_TABLET | Freq: Two times a day (BID) | ORAL | Status: DC

## 2014-07-05 MED ORDER — RIVAROXABAN 20 MG PO TABS: 20.0000 mg | ORAL_TABLET | Freq: Every day | ORAL | Status: DC

## 2014-07-05 MED ORDER — ACETAMINOPHEN 325 MG PO TABS: 650.0000 mg | ORAL_TABLET | Freq: Four times a day (QID) | ORAL | Status: AC | PRN

## 2014-07-05 NOTE — Progress Notes (Signed)
Pt t be discharged back to [facility]. Foley removed. Pt has voided. Had bm today. On 2l 02 sats in mid 90's. Alert and oriented. Appetite good. Feeding herself enthusiastically. Vss. Pt to be transported by ems. Report calle d to the facility.

## 2014-07-05 NOTE — Discharge Summary (Signed)
[facility] [place] [facility]   PATIENT NAME: Kirsten Tran    MR#:  [id number]  DATE OF BIRTH:  1930-06-09  DATE OF ADMISSION:  06/30/2014 ADMITTING PHYSICIAN: Arnaldo Natal, MD  DATE OF DISCHARGE: 07/05/2014 PRIMARY CARE PHYSICIAN: No primary care provider on file.    ADMISSION DIAGNOSIS:  Parkinson disease [G20] Pain [R52] UTI (lower urinary tract infection) [N39.0] Atrial fibrillation with rapid ventricular response [I48.91] Dementia, without behavioral disturbance [F03.90] Atrial fibrillation, unspecified [I48.91] Sepsis, due to unspecified organism [A41.9] Fever, unspecified fever cause [R50.9]  DISCHARGE DIAGNOSIS:  Principal Problem:   UTI (lower urinary tract infection) Active Problems:   Sepsis   Parkinson disease   Atrial fibrillation, unspecified   SECONDARY DIAGNOSIS:   Past Medical History  Diagnosis Date  . Dementia   . Parkinson disease   . Essential hypertension   . Atrial fibrillation, unspecified   . Stroke   . Renal disorder   . Major depression, recurrent, chronic   . Schizoaffective disorder, bipolar type   . Hypercholesteremia   . Mood disorder   . Insomnia   . Renal insufficiency     HOSPITAL COURSE:   Brief history and physical  Patient came to the ED with a chief complaint of altered mental status. She was apparently vomiting and febrile. Please review history and physical for details.  Hospital course.   * Sepsis- likely source UTI with hematuria , healthcare associated pneumonia on the left lower lobe and acute cholecystitis Patient was initially started on Invanz and levofloxacin. - Blood cultures and urine cultures with Escherichia coli which is pansensitive- will discontinue empiric invanz . We have continued levofloxacin and discontinued vancomycin . Incentive spirometry - IV fluids-discontinued -Hematuria is improving, no gross hematuria in the urine bag  * Acute  cholecystitis-incidental finding on [place] abd - - appreciate surgical consult- CAT scan of the abdomen and pelvis has revealed contracted gallbladder with multiple gallstones, discussed with Dr. Michela Pitcher. Not considering percutaneous stent as gallbladder is contracted-Patient is not a surgical candidate. Patient is asymptomatic  -  advanced diet as tolerated -  * Afib- rate controlled, on metoprolol On xarelto. Cont anticoagulation - as h/o CVA-   * HTN-monitor BP, with sepsis Currently on metoprolol, enalapril and lasix-continued lasix as CXR with vascular congestion, improved   * Schizoaffective disorder- on lithium and abilify, stable  * CKD??-monitor, renal  function is in the normal range with normal BUN and creatinine. GFR is at 60. I'm not sure if really patient has chronic kidney disease  * DVT prophylaxis- on xarelto  Discharge home with 2 L of oxygen via nasal cannula. Continue CPAP daily at bedtime.  DISCHARGE CONDITIONS:   Satisfactory  CONSULTS OBTAINED:  Treatment Team:  Tiney Rouge III, MD   PROCEDURES none  DRUG ALLERGIES:  No Known Allergies  DISCHARGE MEDICATIONS:   Current Discharge Medication List    START taking these medications   Details  acetaminophen (TYLENOL) 325 MG tablet Take 2 tablets (650 mg total) by mouth every 6 (six) hours as needed for mild pain (or Fever >/= 101).    levofloxacin (LEVAQUIN) 750 MG tablet Take 1 tablet (750 mg total) by mouth daily. Qty: 5 tablet, Refills: 0      CONTINUE these medications which have CHANGED   Details  metoprolol tartrate (LOPRESSOR) 25 MG tablet Take 1 tablet (25 mg total) by mouth 2 (two) times daily. Qty: 60 tablet, Refills: 0    rivaroxaban (XARELTO) 20  MG TABS tablet Take 1 tablet (20 mg total) by mouth daily. Qty: 30 tablet, Refills: 0      CONTINUE these medications which have NOT CHANGED   Details  alendronate (FOSAMAX) 70 MG tablet Take 1 tablet by mouth every 30 (thirty) days.    alum &  mag hydroxide-simeth (MAALOX/MYLANTA) 200-200-20 MG/5ML suspension Take 30 mLs by mouth every 6 (six) hours as needed for indigestion or heartburn.    desonide (DESOWEN) 0.05 % lotion Apply topically.    latanoprost (XALATAN) 0.005 % ophthalmic solution Apply 1 drop to eye at bedtime.    Multiple Vitamins-Minerals (MULTIVITAMIN WITH MINERALS) tablet Take 1 tablet by mouth daily.    polyethylene glycol powder (GLYCOLAX/MIRALAX) powder Take 1 Dose by mouth daily.    ARIPiprazole (ABILIFY) 2 MG tablet Take 1 tablet by mouth daily.    Calcium-Vitamin D 600-200 MG-UNIT per tablet Take 1 tablet by mouth 2 (two) times daily.    COMBIGAN 0.2-0.5 % ophthalmic solution Place 1 drop into both eyes daily.    Emollient (CERAVE) CREA Apply 1 application topically daily.    enalapril (VASOTEC) 5 MG tablet Take 1 tablet by mouth daily.    furosemide (LASIX) 20 MG tablet Take 1 tablet by mouth daily.    ipratropium-albuterol (DUONEB) 0.5-2.5 (3) MG/3ML SOLN Inhale 3 mLs into the lungs every 6 (six) hours as needed.    Lactobacillus (ACIDOPHILUS) CAPS capsule Take 2 capsules by mouth daily.    levothyroxine (SYNTHROID, LEVOTHROID) 25 MCG tablet Take 1 tablet by mouth daily.    lithium carbonate 150 MG capsule Take 1 capsule by mouth at bedtime.    omega-3 acid ethyl esters (LOVAZA) 1 G capsule Take 2 capsules by mouth 2 (two) times daily.    pravastatin (PRAVACHOL) 20 MG tablet Take 1 tablet by mouth daily.         DISCHARGE INSTRUCTIONS:   continue oxygen 2 L via nasal cannula CPAP daily at bedtime. Continue home PT at [facility] Follow-up with primary care physician in 2-3 days   DIET:   Healthy heart DISCHARGE CONDITION:  Stable  ACTIVITY:  Activity as tolerated  OXYGEN:  Home Oxygen: Yes.     Oxygen Delivery: 2 L  O2 via nasal cannula  DISCHARGE LOCATION:  nursing home   If you experience worsening of your admission symptoms, develop shortness of breath, life  threatening emergency, suicidal or homicidal thoughts you must seek medical attention immediately by calling 911 or calling your MD immediately  if symptoms less severe.  You Must read complete instructions/literature along with all the possible adverse reactions/side effects for all the Medicines you take and that have been prescribed to you. Take any new Medicines after you have completely understood and accpet all the possible adverse reactions/side effects.   Please note  You were cared for by a hospitalist during your hospital stay. If you have any questions about your discharge medications or the care you received while you were in the hospital after you are discharged, you can call the unit and asked to speak with the hospitalist on call if the hospitalist that took care of you is not available. Once you are discharged, your primary care physician will handle any further medical issues. Please note that NO REFILLS for any discharge medications will be authorized once you are discharged, as it is imperative that you return to your primary care physician (or establish a relationship with a primary care physician if you do not have one) for  your aftercare needs so that they can reassess your need for medications and monitor your lab values.     Today  Chief Complaint  Patient presents with  . Code Sepsis    ams vomiting afib rvr fever tachyapnea   Patient is feeling better. Tolerating advanced diet. No complaints  ROS:  CONSTITUTIONAL: Denies fevers, chills. Denies any fatigue, weakness.  EYES: Denies blurry vision, double vision, eye pain. EARS, NOSE, THROAT: Denies tinnitus, ear pain, hearing loss. RESPIRATORY: Denies cough, wheeze, shortness of breath.  CARDIOVASCULAR: Denies chest pain, palpitations, edema.  GASTROINTESTINAL: Denies nausea, vomiting, diarrhea, abdominal pain. Denies bright red blood per rectum. GENITOURINARY: Denies dysuria, hematuria. ENDOCRINE: Denies nocturia or  thyroid problems. HEMATOLOGIC AND LYMPHATIC: Denies easy bruising or bleeding. SKIN: Denies rash or lesion. MUSCULOSKELETAL: Denies pain in neck, back, shoulder, knees, hips or arthritic symptoms.  NEUROLOGIC: Denies paralysis, paresthesias.  PSYCHIATRIC: Denies anxiety or depressive symptoms.   VITAL SIGNS:  Blood pressure 114/54, pulse 109, temperature 98.7 F (37.1 C), temperature source Oral, resp. rate 19, height 5\' 5"  (1.651 m), weight 118.933 kg (262 lb 3.2 oz), SpO2 99 %.  I/O:   Intake/Output Summary (Last 24 hours) at 07/05/14 1452 Last data filed at 07/05/14 1305  Gross per 24 hour  Intake    240 ml  Output   2450 ml  Net  -2210 ml    PHYSICAL EXAMINATION:  GENERAL:  81 y.o.-year-old patient lying in the bed with no acute distress.  EYES: Pupils equal, round, reactive to light and accommodation. No scleral icterus. Extraocular muscles intact.  HEENT: Head atraumatic, normocephalic. Oropharynx and nasopharynx clear.  NECK:  Supple, no jugular venous distention. No thyroid enlargement, no tenderness.  LUNGS: Normal breath sounds bilaterally, no wheezing, rales,rhonchi or crepitation. No use of accessory muscles of respiration.  CARDIOVASCULAR: S1, S2 normal. No murmurs, rubs, or gallops.  ABDOMEN: Soft, non-tender, non-distended. Bowel sounds present. No organomegaly or mass.  EXTREMITIES: No pedal edema, cyanosis, or clubbing.  NEUROLOGIC: Cranial nerves II through XII are intact. Muscle strength 5/5 in all extremities. Sensation intact. Gait not checked.  PSYCHIATRIC: The patient is alert and oriented x 3.  SKIN: No obvious rash, lesion, or ulcer.   DATA REVIEW:   CBC  Recent Labs Lab 07/03/14 0520  WBC 12.5*  HGB 14.3  HCT 43.5  PLT 165    Chemistries   Recent Labs Lab 07/03/14 0520 07/04/14 0511  NA 140  --   K 4.0  --   CL 99*  --   CO2 34*  --   GLUCOSE 155*  --   BUN 19  --   CREATININE 0.99 0.94  CALCIUM 9.1  --   AST 29  --   ALT 76*   --   ALKPHOS 73  --   BILITOT 0.8  --     Cardiac Enzymes  Recent Labs Lab 06/30/14 0335  TROPONINI 0.06*    Microbiology Results  Results for orders placed or performed during the hospital encounter of 06/30/14  Culture, blood (routine x 2)     Status: None   Collection Time: 06/30/14  3:35 AM  Result Value Ref Range Status   Specimen Description BLOOD  Final   Special Requests LFT HAND  Final   Culture  Setup Time   Final    GRAM NEGATIVE RODS AEROBIC BOTTLE ONLY CRITICAL RESULT CALLED TO, READ BACK BY AND VERIFIED WITH: C/BROOK ROBERTSON 07/01/14 AT 2050 BY HS CONFIRMED BY RW  Culture ESCHERICHIA COLI AEROBIC BOTTLE ONLY   Final   Report Status 07/05/2014 FINAL  Final   Organism ID, Bacteria ESCHERICHIA COLI  Final      Susceptibility   Escherichia coli - MIC*    AMPICILLIN 16 INTERMEDIATE Intermediate     CEFTAZIDIME <=1 SENSITIVE Sensitive     CEFAZOLIN <=4 SENSITIVE Sensitive     CEFTRIAXONE <=1 SENSITIVE Sensitive     CIPROFLOXACIN <=0.25 SENSITIVE Sensitive     GENTAMICIN <=1 SENSITIVE Sensitive     IMIPENEM <=0.25 SENSITIVE Sensitive     TRIMETH/SULFA <=20 SENSITIVE Sensitive     CEFOXITIN 16 RESISTANT Resistant     * ESCHERICHIA COLI  Culture, blood (routine x 2)     Status: None   Collection Time: 06/30/14  3:37 AM  Result Value Ref Range Status   Specimen Description BLOOD  Final   Special Requests RT HAND  Final   Culture  Setup Time   Final    GRAM NEGATIVE RODS AEROBIC BOTTLE ONLY CRITICAL RESULT CALLED TO, READ BACK BY AND VERIFIED WITH: RACHEL MCRAE AT 0105 ON 07/01/14 RWW CONFIRMED BY PH    Culture ESCHERICHIA COLI AEROBIC BOTTLE ONLY   Final   Report Status 07/05/2014 FINAL  Final   Organism ID, Bacteria ESCHERICHIA COLI  Final      Susceptibility   Escherichia coli - MIC*    AMPICILLIN 4 SENSITIVE Sensitive     CEFTAZIDIME <=1 SENSITIVE Sensitive     CEFAZOLIN <=4 SENSITIVE Sensitive     CEFTRIAXONE <=1 SENSITIVE Sensitive      CIPROFLOXACIN <=0.25 SENSITIVE Sensitive     GENTAMICIN <=1 SENSITIVE Sensitive     IMIPENEM <=0.25 SENSITIVE Sensitive     TRIMETH/SULFA <=20 SENSITIVE Sensitive     CEFOXITIN 8 SENSITIVE Sensitive     * ESCHERICHIA COLI  Urine culture     Status: None   Collection Time: 06/30/14  4:19 AM  Result Value Ref Range Status   Specimen Description URINE, CATHETERIZED  Final   Special Requests NONE  Final   Culture >=100,000 COLONIES/mL ESCHERICHIA COLI  Final   Report Status 07/03/2014 FINAL  Final   Organism ID, Bacteria ESCHERICHIA COLI  Final      Susceptibility   Escherichia coli - MIC*    AMPICILLIN <=2 SENSITIVE Sensitive     CEFTAZIDIME <=1 SENSITIVE Sensitive     CEFAZOLIN <=4 SENSITIVE Sensitive     CEFTRIAXONE <=1 SENSITIVE Sensitive     CIPROFLOXACIN <=0.25 SENSITIVE Sensitive     GENTAMICIN <=1 SENSITIVE Sensitive     IMIPENEM <=0.25 SENSITIVE Sensitive     TRIMETH/SULFA <=20 SENSITIVE Sensitive     CEFOXITIN <=4 SENSITIVE Sensitive     NITROFURANTOIN Value in next row Sensitive      SENSITIVE<=16    * >=100,000 COLONIES/mL ESCHERICHIA COLI    RADIOLOGY:  Dg Chest 2 View  07/02/2014   CLINICAL DATA:  Cough. Shortness of breath. Atrial fibrillation. Urosepsis.  EXAM: CHEST  2 VIEW  COMPARISON:  06/30/2014  FINDINGS: Mild cardiomegaly stable.  No evidence of congestive heart failure.  Increased opacity is noted in the left lung base which may be due to atelectasis or infiltrate. Right lung remains clear. No evidence of pleural effusion.  IMPRESSION: Increased left basilar atelectasis versus infiltrate.  Stable mild cardiomegaly.   Electronically Signed   By: Myles Rosenthal M.D.   On: 07/02/2014 11:42   Ct Abdomen Pelvis W Contrast  07/01/2014   CLINICAL DATA:  Nausea and vomiting for 2 days, history Parkinson's disease, dementia, hypertension, stroke, atrial fibrillation  EXAM: CT ABDOMEN AND PELVIS WITH CONTRAST  TECHNIQUE: Multidetector CT imaging of the abdomen and pelvis was  performed using the standard protocol following bolus administration of intravenous contrast. Sagittal and coronal MPR images reconstructed from axial data set.  CONTRAST:  80mL OMNIPAQUE IOHEXOL 300 MG/ML SOLN IV. Dilute oral contrast.  COMPARISON:  None  FINDINGS: Bibasilar atelectasis.  RIGHT renal cyst 3.0 x 2.5 cm.  Age-related renal cortical atrophy.  Multiple gallstones in gallbladder, which appears contracted.  Nonspecific 10 mm low-attenuation focus posterior RIGHT lobe liver image 16.  Liver, spleen, pancreas, kidneys and adrenal glands otherwise normal appearance.  Mild descending and sigmoid colonic diverticulosis without evidence of diverticulitis.  Stomach and bowel loops otherwise normal appearance.  Uterus surgically absent with nonvisualization of ovaries and appendix.  Bladder decompressed by Foley catheter with unremarkable ureters.  Scattered atherosclerotic calcifications.  No mass, adenopathy, free fluid, free air, or inflammatory process.  Suspect paraesophageal herniation of fat into the inferior mediastinum.  Mitral annular and question aortic valvular calcifications noted.  Bones demineralized with degenerative disc and facet disease changes of the lumbar spine associated with biconvex scoliosis.  IMPRESSION: Contracted gallbladder containing multiple gallstones.  Atrophic kidneys with small RIGHT renal cyst.  Distal colonic diverticulosis without evidence of diverticulitis.  No definite acute intra-abdominal or intrapelvic process identify.   Electronically Signed   By: Ulyses SouthwardMark  Boles M.D.   On: 07/01/2014 19:58    EKG:   Orders placed or performed during the hospital encounter of 06/30/14  . EKG 12-Lead  . EKG 12-Lead      Management plans discussed with the patient, family and they are in agreement.  CODE STATUS:     Code Status Orders        Start     Ordered   06/30/14 1105  Full code   Continuous     06/30/14 1104      TOTAL TIME TAKING CARE OF THIS PATIENT: 45  minutes.    @[facility] @  on 07/05/2014 at 2:52 PM  Between 7am to 6pm - Pager - [phone]  After 6pm go to [email or web address] - password [email or web address] [facility]  [facility]  Office  [phone]  CC: Primary care physician; No primary care provider on file.

## 2014-07-05 NOTE — Clinical Social Work Note (Signed)
CSW notified pt, pt's daughter, RN and facility the pt would DC back to [facility], via EMS.  CSW signing off.

## 2014-07-05 NOTE — Progress Notes (Signed)
Physical Therapy Treatment Patient Details Name: Kirsten Tran MRN: [id number] DOB: July 19, 1930 Today's Date: 07/05/2014    History of Present Illness The patient presents emergency department via EMS from her nursing home where she was apparently vomiting and febrile. Patient was found to be tachycardic upon admission; she was admitted with UTI/possible sepsis; Patient has a PMH significant for dementia/parkinson's disease and schizoaffective disorder; She was living at [facility] prior to admittance    PT Comments    Pt with improved tolerance to rx today.  Improved performance of bed mobility and transfers requiring vc's for hand placement and sequencing.  Performed sit to stand x 2 with trial of standing tolerance up to 2 min ea.  Pt became fatigued after standing. Anticipate d/c tomorrow returning to [employer].   Follow Up Recommendations  Home health PT     Equipment Recommendations       Recommendations for Other Services       Precautions / Restrictions Precautions Precautions: Fall Restrictions Weight Bearing Restrictions: No    Mobility  Bed Mobility Overal bed mobility: Needs Assistance Bed Mobility: Supine to Sit;Sit to Supine Rolling: Modified independent (Device/Increase time) Sidelying to sit: Min guard Supine to sit: Min guard Sit to supine: Min guard   General bed mobility comments: Pt with improved bed mobility today, able to self boost with cues and cues only for supine to sit  Transfers Overall transfer level: Needs assistance Equipment used: Rolling walker (2 wheeled) Transfers: Sit to/from Stand Sit to Stand: Min assist         General transfer comment: sit<>stand, cues for hand placement, minA, posterior lean noted   Ambulation/Gait                 Stairs            Wheelchair Mobility    Modified Rankin (Stroke Patients Only)       Balance                                     Cognition Arousal/Alertness: Awake/alert Behavior During Therapy: WFL for tasks assessed/performed Overall Cognitive Status: Within Functional Limits for tasks assessed                      Exercises Other Exercises Other Exercises: performed supine SLR, heel slides, and ankle pumps x 15 ea, seated LAQ  at EOB x 15 ea    General Comments        Pertinent Vitals/Pain Pain Assessment: No/denies pain    Home Living                      Prior Function            PT Goals (current goals can now be found in the care plan section) Acute Rehab PT Goals PT Goal Formulation: With patient Time For Goal Achievement: 07/15/14 Potential to Achieve Goals: Good    Frequency  Min 2X/week    PT Plan Current plan remains appropriate    Co-evaluation             End of Session Equipment Utilized During Treatment: Gait belt Activity Tolerance: Patient limited by fatigue;Patient tolerated treatment well Patient left: in bed;with call bell/phone within reach;with bed alarm set     Time: 1130-1155 PT Time Calculation (min) (ACUTE ONLY): 25 min  Charges:  $Therapeutic  Exercise: 8-22 mins $Therapeutic Activity: 8-22 mins                    G Codes:      Akia Desroches 07/05/2014, 12:26 PM  Lonnette Shrode, PTA

## 2014-07-05 NOTE — Progress Notes (Signed)
Pt discharged, tele removed, taken via EMS, belongings transported

## 2014-07-05 NOTE — Care Management (Signed)
Contacted attending regarding need to cancel order for home health.

## 2014-07-10 DIAGNOSIS — F39 Unspecified mood [affective] disorder: Secondary | ICD-10-CM | POA: Diagnosis not present

## 2014-07-10 DIAGNOSIS — G4700 Insomnia, unspecified: Secondary | ICD-10-CM | POA: Diagnosis not present

## 2014-07-10 DIAGNOSIS — F332 Major depressive disorder, recurrent severe without psychotic features: Secondary | ICD-10-CM | POA: Diagnosis not present

## 2014-07-21 DIAGNOSIS — F0280 Dementia in other diseases classified elsewhere without behavioral disturbance: Secondary | ICD-10-CM | POA: Diagnosis not present

## 2014-07-21 DIAGNOSIS — I4891 Unspecified atrial fibrillation: Secondary | ICD-10-CM | POA: Diagnosis not present

## 2014-07-21 DIAGNOSIS — E119 Type 2 diabetes mellitus without complications: Secondary | ICD-10-CM | POA: Diagnosis not present

## 2014-07-21 DIAGNOSIS — I6789 Other cerebrovascular disease: Secondary | ICD-10-CM | POA: Diagnosis not present

## 2014-08-22 DIAGNOSIS — R0602 Shortness of breath: Secondary | ICD-10-CM | POA: Diagnosis not present

## 2014-08-22 DIAGNOSIS — I482 Chronic atrial fibrillation: Secondary | ICD-10-CM | POA: Diagnosis not present

## 2014-08-22 DIAGNOSIS — G4733 Obstructive sleep apnea (adult) (pediatric): Secondary | ICD-10-CM | POA: Diagnosis not present

## 2014-08-22 DIAGNOSIS — G309 Alzheimer's disease, unspecified: Secondary | ICD-10-CM | POA: Diagnosis not present

## 2014-08-22 DIAGNOSIS — I959 Hypotension, unspecified: Secondary | ICD-10-CM | POA: Diagnosis not present

## 2014-09-05 DIAGNOSIS — R0602 Shortness of breath: Secondary | ICD-10-CM | POA: Diagnosis not present

## 2014-09-05 DIAGNOSIS — G4733 Obstructive sleep apnea (adult) (pediatric): Secondary | ICD-10-CM | POA: Diagnosis not present

## 2014-09-05 DIAGNOSIS — J449 Chronic obstructive pulmonary disease, unspecified: Secondary | ICD-10-CM | POA: Diagnosis not present

## 2014-09-05 DIAGNOSIS — I482 Chronic atrial fibrillation: Secondary | ICD-10-CM | POA: Diagnosis not present

## 2014-09-06 DIAGNOSIS — E119 Type 2 diabetes mellitus without complications: Secondary | ICD-10-CM | POA: Diagnosis not present

## 2014-09-06 DIAGNOSIS — M79672 Pain in left foot: Secondary | ICD-10-CM | POA: Diagnosis not present

## 2014-09-06 DIAGNOSIS — M79671 Pain in right foot: Secondary | ICD-10-CM | POA: Diagnosis not present

## 2014-09-06 DIAGNOSIS — B351 Tinea unguium: Secondary | ICD-10-CM | POA: Diagnosis not present

## 2014-09-25 DIAGNOSIS — F39 Unspecified mood [affective] disorder: Secondary | ICD-10-CM | POA: Diagnosis not present

## 2014-09-25 DIAGNOSIS — F332 Major depressive disorder, recurrent severe without psychotic features: Secondary | ICD-10-CM | POA: Diagnosis not present

## 2014-09-25 DIAGNOSIS — G4700 Insomnia, unspecified: Secondary | ICD-10-CM | POA: Diagnosis not present

## 2014-10-09 DIAGNOSIS — F319 Bipolar disorder, unspecified: Secondary | ICD-10-CM | POA: Diagnosis not present

## 2014-12-02 DIAGNOSIS — I4891 Unspecified atrial fibrillation: Secondary | ICD-10-CM | POA: Diagnosis not present

## 2014-12-02 DIAGNOSIS — E119 Type 2 diabetes mellitus without complications: Secondary | ICD-10-CM | POA: Diagnosis not present

## 2014-12-02 DIAGNOSIS — F0280 Dementia in other diseases classified elsewhere without behavioral disturbance: Secondary | ICD-10-CM | POA: Diagnosis not present

## 2014-12-02 DIAGNOSIS — I6789 Other cerebrovascular disease: Secondary | ICD-10-CM | POA: Diagnosis not present

## 2015-01-08 DIAGNOSIS — Z79899 Other long term (current) drug therapy: Secondary | ICD-10-CM | POA: Diagnosis not present

## 2015-01-08 DIAGNOSIS — F319 Bipolar disorder, unspecified: Secondary | ICD-10-CM | POA: Diagnosis not present

## 2016-07-11 ENCOUNTER — Emergency Department: Payer: Medicare Other

## 2016-07-11 ENCOUNTER — Inpatient Hospital Stay
Admission: EM | Admit: 2016-07-11 | Discharge: 2016-07-15 | DRG: 871 | Disposition: A | Discharge status: Skilled Nursing Facility | Payer: Medicare Other | Attending: Internal Medicine | Admitting: Internal Medicine

## 2016-07-11 ENCOUNTER — Encounter: Payer: Self-pay | Admitting: *Deleted

## 2016-07-11 DIAGNOSIS — K819 Cholecystitis, unspecified: Secondary | ICD-10-CM | POA: Diagnosis not present

## 2016-07-11 DIAGNOSIS — F250 Schizoaffective disorder, bipolar type: Secondary | ICD-10-CM | POA: Diagnosis present

## 2016-07-11 DIAGNOSIS — I4891 Unspecified atrial fibrillation: Secondary | ICD-10-CM | POA: Diagnosis present

## 2016-07-11 DIAGNOSIS — J9610 Chronic respiratory failure, unspecified whether with hypoxia or hypercapnia: Secondary | ICD-10-CM | POA: Diagnosis present

## 2016-07-11 DIAGNOSIS — A419 Sepsis, unspecified organism: Secondary | ICD-10-CM | POA: Diagnosis present

## 2016-07-11 DIAGNOSIS — G4700 Insomnia, unspecified: Secondary | ICD-10-CM | POA: Diagnosis present

## 2016-07-11 DIAGNOSIS — Z6841 Body Mass Index (BMI) 40.0 and over, adult: Secondary | ICD-10-CM | POA: Diagnosis not present

## 2016-07-11 DIAGNOSIS — R6521 Severe sepsis with septic shock: Secondary | ICD-10-CM | POA: Diagnosis present

## 2016-07-11 DIAGNOSIS — G4733 Obstructive sleep apnea (adult) (pediatric): Secondary | ICD-10-CM | POA: Diagnosis present

## 2016-07-11 DIAGNOSIS — N183 Chronic kidney disease, stage 3 (moderate): Secondary | ICD-10-CM | POA: Diagnosis present

## 2016-07-11 DIAGNOSIS — Z66 Do not resuscitate: Secondary | ICD-10-CM | POA: Diagnosis present

## 2016-07-11 DIAGNOSIS — K8010 Calculus of gallbladder with chronic cholecystitis without obstruction: Secondary | ICD-10-CM | POA: Diagnosis present

## 2016-07-11 DIAGNOSIS — K859 Acute pancreatitis without necrosis or infection, unspecified: Secondary | ICD-10-CM | POA: Diagnosis present

## 2016-07-11 DIAGNOSIS — F339 Major depressive disorder, recurrent, unspecified: Secondary | ICD-10-CM | POA: Diagnosis present

## 2016-07-11 DIAGNOSIS — K810 Acute cholecystitis: Secondary | ICD-10-CM | POA: Diagnosis present

## 2016-07-11 DIAGNOSIS — I10 Essential (primary) hypertension: Secondary | ICD-10-CM | POA: Diagnosis present

## 2016-07-11 DIAGNOSIS — Z79899 Other long term (current) drug therapy: Secondary | ICD-10-CM

## 2016-07-11 DIAGNOSIS — G20 Parkinson's disease: Secondary | ICD-10-CM | POA: Diagnosis present

## 2016-07-11 DIAGNOSIS — R0600 Dyspnea, unspecified: Secondary | ICD-10-CM

## 2016-07-11 DIAGNOSIS — E7800 Pure hypercholesterolemia, unspecified: Secondary | ICD-10-CM | POA: Diagnosis present

## 2016-07-11 DIAGNOSIS — Z8673 Personal history of transient ischemic attack (TIA), and cerebral infarction without residual deficits: Secondary | ICD-10-CM

## 2016-07-11 DIAGNOSIS — Z1612 Extended spectrum beta lactamase (ESBL) resistance: Secondary | ICD-10-CM | POA: Diagnosis present

## 2016-07-11 DIAGNOSIS — R531 Weakness: Secondary | ICD-10-CM | POA: Diagnosis present

## 2016-07-11 DIAGNOSIS — A4151 Sepsis due to Escherichia coli [E. coli]: Secondary | ICD-10-CM | POA: Diagnosis present

## 2016-07-11 DIAGNOSIS — F0390 Unspecified dementia without behavioral disturbance: Secondary | ICD-10-CM | POA: Diagnosis present

## 2016-07-11 DIAGNOSIS — N390 Urinary tract infection, site not specified: Secondary | ICD-10-CM | POA: Diagnosis present

## 2016-07-11 DIAGNOSIS — E039 Hypothyroidism, unspecified: Secondary | ICD-10-CM | POA: Diagnosis present

## 2016-07-11 DIAGNOSIS — E860 Dehydration: Secondary | ICD-10-CM | POA: Diagnosis present

## 2016-07-11 DIAGNOSIS — K851 Biliary acute pancreatitis without necrosis or infection: Secondary | ICD-10-CM | POA: Diagnosis present

## 2016-07-11 DIAGNOSIS — R7989 Other specified abnormal findings of blood chemistry: Secondary | ICD-10-CM

## 2016-07-11 DIAGNOSIS — Z9981 Dependence on supplemental oxygen: Secondary | ICD-10-CM

## 2016-07-11 DIAGNOSIS — N179 Acute kidney failure, unspecified: Secondary | ICD-10-CM | POA: Diagnosis present

## 2016-07-11 DIAGNOSIS — R945 Abnormal results of liver function studies: Secondary | ICD-10-CM

## 2016-07-11 DIAGNOSIS — E785 Hyperlipidemia, unspecified: Secondary | ICD-10-CM | POA: Diagnosis present

## 2016-07-11 DIAGNOSIS — M8580 Other specified disorders of bone density and structure, unspecified site: Secondary | ICD-10-CM | POA: Diagnosis present

## 2016-07-11 DIAGNOSIS — E669 Obesity, unspecified: Secondary | ICD-10-CM | POA: Diagnosis present

## 2016-07-11 LAB — CBC WITH DIFFERENTIAL/PLATELET
Basophils Absolute: 0 10*3/uL (ref 0–0.1)
Basophils Relative: 0 %
Eosinophils Absolute: 0 10*3/uL (ref 0–0.7)
Eosinophils Relative: 0 %
HEMATOCRIT: 41.3 % (ref 35.0–47.0)
Hemoglobin: 13.9 g/dL (ref 12.0–16.0)
LYMPHS ABS: 1 10*3/uL (ref 1.0–3.6)
LYMPHS PCT: 4 %
MCH: 31.8 pg (ref 26.0–34.0)
MCHC: 33.7 g/dL (ref 32.0–36.0)
MCV: 94.2 fL (ref 80.0–100.0)
MONOS PCT: 4 %
Monocytes Absolute: 1 10*3/uL — ABNORMAL HIGH (ref 0.2–0.9)
NEUTROS PCT: 92 %
Neutro Abs: 22.5 10*3/uL — ABNORMAL HIGH (ref 1.4–6.5)
Platelets: 182 10*3/uL (ref 150–440)
RBC: 4.39 MIL/uL (ref 3.80–5.20)
RDW: 13.2 % (ref 11.5–14.5)
WBC: 24.5 10*3/uL — AB (ref 3.6–11.0)

## 2016-07-11 LAB — COMPREHENSIVE METABOLIC PANEL
ALT: 282 U/L — ABNORMAL HIGH (ref 14–54)
AST: 381 U/L — AB (ref 15–41)
Albumin: 3.2 g/dL — ABNORMAL LOW (ref 3.5–5.0)
Alkaline Phosphatase: 70 U/L (ref 38–126)
Anion gap: 11 (ref 5–15)
BUN: 61 mg/dL — AB (ref 6–20)
CHLORIDE: 102 mmol/L (ref 101–111)
CO2: 28 mmol/L (ref 22–32)
Calcium: 9.6 mg/dL (ref 8.9–10.3)
Creatinine, Ser: 3.01 mg/dL — ABNORMAL HIGH (ref 0.44–1.00)
GFR, EST AFRICAN AMERICAN: 15 mL/min — AB (ref >60)
GFR, EST NON AFRICAN AMERICAN: 13 mL/min — AB (ref >60)
Glucose, Bld: 177 mg/dL — ABNORMAL HIGH (ref 65–99)
POTASSIUM: 4.3 mmol/L (ref 3.5–5.1)
SODIUM: 141 mmol/L (ref 135–145)
Total Bilirubin: 3.4 mg/dL — ABNORMAL HIGH (ref 0.3–1.2)
Total Protein: 6.9 g/dL (ref 6.5–8.1)

## 2016-07-11 LAB — LACTIC ACID, PLASMA: Lactic Acid, Venous: 2.2 mmol/L (ref 0.5–1.9)

## 2016-07-11 LAB — LIPASE, BLOOD: Lipase: 1116 U/L — ABNORMAL HIGH (ref 11–51)

## 2016-07-11 MED ORDER — SODIUM CHLORIDE 0.9 % IV BOLUS (SEPSIS)
1000.0000 mL | Freq: Once | INTRAVENOUS | Status: AC
  Administered 2016-07-11: 1000 mL via INTRAVENOUS

## 2016-07-11 MED ORDER — PIPERACILLIN-TAZOBACTAM 3.375 G IVPB 30 MIN
3.3750 g | Freq: Once | INTRAVENOUS | Status: AC
  Administered 2016-07-11: 3.375 g via INTRAVENOUS
  Filled 2016-07-11: qty 50

## 2016-07-11 NOTE — ED Notes (Signed)
Attempted in and out cath with Jan ED tech and Eileen StanfordJenna RN. Unsuccessful due to patients anatomy.

## 2016-07-11 NOTE — ED Triage Notes (Signed)
Per EMS report, patient is a resident from [facility] and was transported for abnormal labs, weakness, unable to feed self, and poor PO intake.

## 2016-07-11 NOTE — ED Notes (Signed)
Per family patient does wear a CPAP at night. Not sure of the amount of air she gets

## 2016-07-11 NOTE — ED Notes (Signed)
Report given to Kaitlin RN

## 2016-07-11 NOTE — ED Notes (Signed)
Unable to get blood work. Called lab.

## 2016-07-11 NOTE — ED Provider Notes (Signed)
[facility] Emergency Department Provider Note  ____________________________________________  Time seen: Approximately 5:53 PM  I have reviewed the triage vital signs and the nursing notes.   HISTORY  Chief Complaint Weakness  Level 5 caveat:  Portions of the history and physical were unable to be obtained due to dementia   HPI Kirsten Tran is a 83 y.o. female with a history of the Parkinson's disease, dementia, schizoaffective disorder, atrial fibrillation, hypertension, hyperlipidemia who presents for evaluation of generalized weakness. Patient was discharged from the hospital week ago when she was admitted with sepsis from UTI, pneumonia, and an incidental finding of cholecystitis. Patient was evaluated by surgery but no surgical procedure was done at that time since patient was asymptomatic. Patient was discharged on Levaquin and finish that prescription 2 days ago. According to her skilled nursing facility, patient has had 2 days of generalized weakness, decreased by mouth intake, unable to feed herself which is a change from her baseline. Blood work was sent today and patient was sent over here for evaluation of leukocytosis with white count of 33 and LFTs in the 300s. When asked if patient has any pain she says no but when I specifically asked if her abdomen was hurting she says yes. She is unable to describe any further details of location or quality. No documented fever, vomiting, or diarrhea. Patient denies any other complaints at this time.  Past Medical History:  Diagnosis Date  . Atrial fibrillation, unspecified   . Dementia   . Essential hypertension   . Hypercholesteremia   . Insomnia   . Major depression, recurrent, chronic (HCC)   . Mood disorder (HCC)   . Parkinson disease (HCC)   . Renal disorder   . Renal insufficiency   . Schizoaffective disorder, bipolar type (HCC)   . Stroke [facility](HCC)     Patient Active Problem List   Diagnosis  Date Noted  . Pancreatitis 07/11/2016  . Cholecystitis 07/11/2016  . HTN (hypertension) 07/11/2016  . HLD (hyperlipidemia) 07/11/2016  . Sepsis (HCC) 06/30/2014  . UTI (lower urinary tract infection) 06/30/2014  . Parkinson disease (HCC)   . Atrial fibrillation, unspecified   . Dementia     History reviewed. No pertinent surgical history.  Prior to Admission medications   Medication Sig Start Date End Date Taking? Authorizing Provider  acetaminophen (TYLENOL) 325 MG tablet Take 2 tablets (650 mg total) by mouth every 6 (six) hours as needed for mild pain (or Fever >/= 101). 07/05/14  Yes Gouru, Aruna, MD  alendronate (FOSAMAX) 70 MG tablet Take 1 tablet by mouth every 30 (thirty) days. 02/09/06  Yes [provider]  alum & mag hydroxide-simeth (MAALOX/MYLANTA) 200-200-20 MG/5ML suspension Take 30 mLs by mouth every 6 (six) hours as needed for indigestion or heartburn.   Yes [provider]  ARIPiprazole (ABILIFY) 5 MG tablet Take 5 mg by mouth daily.   Yes [provider]  Calcium-Vitamin D 600-200 MG-UNIT per tablet Take 1 tablet by mouth 2 (two) times daily.   Yes [provider]  COMBIGAN 0.2-0.5 % ophthalmic solution Place 1 drop into both eyes daily. 06/27/14  Yes [provider]  desonide (DESOWEN) 0.05 % lotion Apply topically. 02/24/11  Yes [provider]  Emollient (CERAVE) CREA Apply 1 application topically daily.   Yes [provider]  enalapril (VASOTEC) 5 MG tablet Take 1 tablet by mouth daily. 06/29/14  Yes [provider]  Fluticasone-Salmeterol (ADVAIR) 100-50 MCG/DOSE AEPB Inhale 1 puff  into the lungs 2 (two) times daily.   Yes [provider]  furosemide (LASIX) 20 MG tablet Take 1 tablet by mouth daily. 06/19/14  Yes [provider]  ipratropium-albuterol (DUONEB) 0.5-2.5 (3) MG/3ML SOLN Inhale 3 mLs into the lungs every 6 (six) hours as needed. 03/26/14  Yes [provider]    latanoprost (XALATAN) 0.005 % ophthalmic solution Place 1 drop into both eyes at bedtime.    Yes [provider]  levothyroxine (SYNTHROID, LEVOTHROID) 25 MCG tablet Take 1 tablet by mouth daily. 06/24/14  Yes [provider]  lithium carbonate 150 MG capsule Take 1 capsule by mouth at bedtime. 06/01/14  Yes [provider]  metoprolol tartrate (LOPRESSOR) 25 MG tablet Take 1 tablet (25 mg total) by mouth 2 (two) times daily. 07/05/14  Yes Gouru, Deanna Artis, MD  Multiple Vitamins-Minerals (MULTIVITAMIN WITH MINERALS) tablet Take 1 tablet by mouth daily. 02/18/10  Yes [provider]  omega-3 acid ethyl esters (LOVAZA) 1 G capsule Take 2 capsules by mouth 2 (two) times daily. 06/27/14  Yes [provider]  polyethylene glycol powder (GLYCOLAX/MIRALAX) powder Take 1 Dose by mouth daily. 02/18/10  Yes [provider]  pravastatin (PRAVACHOL) 20 MG tablet Take 1 tablet by mouth daily. 06/18/14  Yes [provider]  rivaroxaban (XARELTO) 20 MG TABS tablet Take 1 tablet (20 mg total) by mouth daily. 07/05/14  Yes Gouru, Deanna Artis, MD  Vitamin D, Ergocalciferol, (DRISDOL) 50000 units CAPS capsule Take 50,000 Units by mouth every 30 (thirty) days.   Yes [provider]  Lactobacillus (ACIDOPHILUS) CAPS capsule Take 2 capsules by mouth daily.    [provider]    Allergies Patient has no known allergies.  Family History  Problem Relation Age of Onset  . Family history unknown: Yes    Social History Social History  Substance Use Topics  . Smoking status: Never Smoker  . Smokeless tobacco: Never Used  . Alcohol use No    Review of Systems  Constitutional: Negative for fever. + Generalized weakness Eyes: Negative for visual changes. ENT: Negative for sore throat. Neck: No neck pain  Cardiovascular: Negative for chest pain. Respiratory: Negative for shortness of breath. Gastrointestinal: + diffuse abdominal pain and decreased Po  intake. No vomiting or diarrhea. Genitourinary: Negative for dysuria. Musculoskeletal: Negative for back pain. Skin: Negative for rash. Neurological: Negative for headaches, weakness or numbness. Psych: No SI or HI  ____________________________________________   PHYSICAL EXAM:  VITAL SIGNS: ED Triage Vitals  Enc Vitals Group     BP 07/11/16 1735 (!) 99/58     Pulse Rate 07/11/16 1735 (!) 101     Resp 07/11/16 1735 18     Temp 07/11/16 1735 98.5 F (36.9 C)     Temp Source 07/11/16 1735 Oral     SpO2 07/11/16 1735 98 %     Weight 07/11/16 1736 242 lb 14.4 oz (110.2 kg)     Height --      Head Circumference --      Peak Flow --      Pain Score --      Pain Loc --      Pain Edu? --      Excl. in GC? --     Constitutional: Alert and oriented to self only. no apparent distress. HEENT:      Head: Normocephalic and atraumatic.         Eyes: Conjunctivae are normal. Sclera is non-icteric.  Mouth/Throat: Mucous membranes are dry.       Neck: Supple with no signs of meningismus. Cardiovascular: Tachycardic with regular rhythm. No murmurs, gallops, or rubs. 2+ symmetrical distal pulses are present in all extremities. No JVD. Respiratory: Normal respiratory effort. Lungs are clear to auscultation bilaterally. No wheezes, crackles, or rhonchi.  Gastrointestinal: Soft, diffusely tender to palpation, with positive bowel sounds. No rebound or guarding. Musculoskeletal: Nontender with normal range of motion in all extremities. No edema, cyanosis, or erythema of extremities. Neurologic: Normal speech and language. Face is symmetric. Moving all extremities. No gross focal neurologic deficits are appreciated. Skin: Skin is warm, dry and intact. No rash noted. Psychiatric: Mood and affect are normal. Speech and behavior are normal.  ____________________________________________   LABS (all labs ordered are listed, but only abnormal results are displayed)  Labs Reviewed    COMPREHENSIVE METABOLIC PANEL - Abnormal; Notable for the following:       Result Value   Glucose, Bld 177 (*)    BUN 61 (*)    Creatinine, Ser 3.01 (*)    Albumin 3.2 (*)    AST 381 (*)    ALT 282 (*)    Total Bilirubin 3.4 (*)    GFR calc non Af Amer 13 (*)    GFR calc Af Amer 15 (*)    All other components within normal limits  CBC WITH DIFFERENTIAL/PLATELET - Abnormal; Notable for the following:    WBC 24.5 (*)    Neutro Abs 22.5 (*)    Monocytes Absolute 1.0 (*)    All other components within normal limits  LACTIC ACID, PLASMA - Abnormal; Notable for the following:    Lactic Acid, Venous 2.2 (*)    All other components within normal limits  LIPASE, BLOOD - Abnormal; Notable for the following:    Lipase 1,116 (*)    All other components within normal limits  CULTURE, BLOOD (ROUTINE X 2)  CULTURE, BLOOD (ROUTINE X 2)  URINE CULTURE  LACTIC ACID, PLASMA  URINALYSIS, COMPLETE (UACMP) WITH MICROSCOPIC   ____________________________________________  EKG  ED ECG REPORT I, Nita Sickle, the attending physician, personally viewed and interpreted this ECG.  Atrial fibrillation, rate of 98, normal QTC, left axis deviation, no ST elevations or depressions. Unchanged from prior from 2016 ____________________________________________  RADIOLOGY  CXR; Limited low volume chest without definitive pneumonia. ____________________________________________   PROCEDURES  Procedure(s) performed: None Procedures Critical Care performed: yes  CRITICAL CARE Performed by: Nita Sickle  ?  Total critical care time: 45 min  Critical care time was exclusive of separately billable procedures and treating other patients.  Critical care was necessary to treat or prevent imminent or life-threatening deterioration.  Critical care was time spent personally by me on the following activities: development of treatment plan with patient and/or surrogate as well as nursing,  discussions with consultants, evaluation of patient's response to treatment, examination of patient, obtaining history from patient or surrogate, ordering and performing treatments and interventions, ordering and review of laboratory studies, ordering and review of radiographic studies, pulse oximetry and re-evaluation of patient's condition.  ____________________________________________   INITIAL IMPRESSION / ASSESSMENT AND PLAN / ED COURSE  83 y.o. female with a history of the Parkinson's disease, dementia, schizoaffective disorder, atrial fibrillation, hypertension, hyperlipidemia who presents for evaluation of generalized weakness, decreased appetite and abnormal labs from her skilled nursing facility. Patient is alert and oriented to self only which is her baseline. Patient has diffuse tenderness on her abdomen with no rebound  or guarding, she is afebrile but is tachycardic with a pulse of 101 and low blood pressure of 99/58. Due to her recent multiple infections, sepsis, and cholecystitis treated medically , we'll send labs including lactic acid and blood cultures, sent patient for repeat chest x-ray.  Clinical Course as of Jul 12 2342  Fri Jul 11, 2016  2242 Several attempts in getting IV access and blood work on this patient including nursing staff, lab staff, and US guided. We finally have blood at this time with a white count of 24, lactic of 2.2, lipase of 1100, AST of 381, ALT of 282, T bili of 3.4, AKI with creatinine of 3.01. Patient given 3L and zosyn. [place] ordered.    [CV]    Clinical Course User Index [CV] Nita Sickle, MD    Pertinent labs & imaging results that were available during my care of the patient were reviewed by me and considered in my medical decision making (see chart for details).    ____________________________________________   FINAL CLINICAL IMPRESSION(S) / ED DIAGNOSES  Final diagnoses:  Elevated LFTs  Sepsis (HCC)  AKI (acute kidney injury) (HCC)    Gallstone pancreatitis      NEW MEDICATIONS STARTED DURING THIS VISIT:  New Prescriptions   No medications on file     Note:  This document was prepared using Dragon voice recognition software and may include unintentional dictation errors.    Nita Sickle, MD 07/12/16 [phone]

## 2016-07-11 NOTE — H&P (Signed)
[facility] [place] at [facility]   PATIENT NAME: Kirsten Tran    MR#:  [id number]  DATE OF BIRTH:  29-Aug-1930   DATE OF ADMISSION:  07/11/2016  PRIMARY CARE PHYSICIAN: Patient, No Pcp Per   REQUESTING/REFERRING PHYSICIAN: Don Perking, MD  CHIEF COMPLAINT:   Chief Complaint  Patient presents with  . Weakness    HISTORY OF PRESENT ILLNESS:  Kirsten Tran  is a 83 y.o. female who presents with Malaise and weakness. She has dementia and is unable to contribute much to her history of present illness. She was recently admitted here for sepsis due to UTI and pneumonia. She was found to have cholecystitis at that time. She was sent home on Levaquin. She presents again today with pancreatitis and persistent cholecystitis. She meets sepsis criteria. Hospitalists were called for admission  PAST MEDICAL HISTORY:   Past Medical History:  Diagnosis Date  . Atrial fibrillation, unspecified   . Dementia   . Essential hypertension   . Hypercholesteremia   . Insomnia   . Major depression, recurrent, chronic (HCC)   . Mood disorder (HCC)   . Parkinson disease (HCC)   . Renal disorder   . Renal insufficiency   . Schizoaffective disorder, bipolar type (HCC)   . Stroke [facility])     PAST SURGICAL HISTORY:  History reviewed. No pertinent surgical history.  SOCIAL HISTORY:   Social History  Substance Use Topics  . Smoking status: Never Smoker  . Smokeless tobacco: Never Used  . Alcohol use No    FAMILY HISTORY:   Family History  Problem Relation Age of Onset  . Family history unknown: Yes    DRUG ALLERGIES:  No Known Allergies  MEDICATIONS AT HOME:   Prior to Admission medications   Medication Sig Start Date End Date Taking? Authorizing Provider  acetaminophen (TYLENOL) 325 MG tablet Take 2 tablets (650 mg total) by mouth every 6 (six) hours as needed for mild pain (or Fever >/= 101). 07/05/14  Yes Gouru, Aruna, MD  alendronate (FOSAMAX) 70 MG  tablet Take 1 tablet by mouth every 30 (thirty) days. 02/09/06  Yes [provider]  alum & mag hydroxide-simeth (MAALOX/MYLANTA) 200-200-20 MG/5ML suspension Take 30 mLs by mouth every 6 (six) hours as needed for indigestion or heartburn.   Yes [provider]  ARIPiprazole (ABILIFY) 5 MG tablet Take 5 mg by mouth daily.   Yes [provider]  Calcium-Vitamin D 600-200 MG-UNIT per tablet Take 1 tablet by mouth 2 (two) times daily.   Yes [provider]  COMBIGAN 0.2-0.5 % ophthalmic solution Place 1 drop into both eyes daily. 06/27/14  Yes [provider]  desonide (DESOWEN) 0.05 % lotion Apply topically. 02/24/11  Yes [provider]  Emollient (CERAVE) CREA Apply 1 application topically daily.   Yes [provider]  enalapril (VASOTEC) 5 MG tablet Take 1 tablet by mouth daily. 06/29/14  Yes [provider]  Fluticasone-Salmeterol (ADVAIR) 100-50 MCG/DOSE AEPB Inhale 1 puff into the lungs 2 (two) times daily.   Yes [provider]  furosemide (LASIX) 20 MG tablet Take 1 tablet by mouth daily. 06/19/14  Yes [provider]  ipratropium-albuterol (DUONEB) 0.5-2.5 (3) MG/3ML SOLN Inhale 3 mLs into the lungs every 6 (six) hours as needed. 03/26/14  Yes [provider]  latanoprost (XALATAN) 0.005 % ophthalmic solution Place 1 drop into both eyes at bedtime.    Yes [provider]  levothyroxine (SYNTHROID, LEVOTHROID) 25 MCG tablet Take  1 tablet by mouth daily. 06/24/14  Yes [provider]  lithium carbonate 150 MG capsule Take 1 capsule by mouth at bedtime. 06/01/14  Yes [provider]  metoprolol tartrate (LOPRESSOR) 25 MG tablet Take 1 tablet (25 mg total) by mouth 2 (two) times daily. 07/05/14  Yes Gouru, Deanna Artis, MD  Multiple Vitamins-Minerals (MULTIVITAMIN WITH MINERALS) tablet Take 1 tablet by mouth daily. 02/18/10  Yes [provider]  omega-3 acid ethyl esters (LOVAZA) 1 G  capsule Take 2 capsules by mouth 2 (two) times daily. 06/27/14  Yes [provider]  polyethylene glycol powder (GLYCOLAX/MIRALAX) powder Take 1 Dose by mouth daily. 02/18/10  Yes [provider]  pravastatin (PRAVACHOL) 20 MG tablet Take 1 tablet by mouth daily. 06/18/14  Yes [provider]  rivaroxaban (XARELTO) 20 MG TABS tablet Take 1 tablet (20 mg total) by mouth daily. 07/05/14  Yes Gouru, Deanna Artis, MD  Vitamin D, Ergocalciferol, (DRISDOL) 50000 units CAPS capsule Take 50,000 Units by mouth every 30 (thirty) days.   Yes [provider]  Lactobacillus (ACIDOPHILUS) CAPS capsule Take 2 capsules by mouth daily.    [provider]    REVIEW OF SYSTEMS:  Review of Systems  Unable to perform ROS: Dementia     VITAL SIGNS:   Vitals:   07/11/16 1736 07/11/16 1930 07/11/16 2220 07/11/16 2230  BP:  107/86 (!) 111/92 105/70  Pulse:      Resp:  19 (!) 24 (!) 23  Temp:      TempSrc:      SpO2:      Weight: 110.2 kg (242 lb 14.4 oz)      Wt Readings from Last 3 Encounters:  07/11/16 110.2 kg (242 lb 14.4 oz)  07/05/14 118.9 kg (262 lb 3.2 oz)    PHYSICAL EXAMINATION:  Physical Exam  Vitals reviewed. Constitutional: She is oriented to person, place, and time. She appears well-developed and well-nourished. No distress.  HENT:  Head: Normocephalic and atraumatic.  Dry mucous membranes  Eyes: Conjunctivae and EOM are normal. Pupils are equal, round, and reactive to light. No scleral icterus.  Neck: Normal range of motion. Neck supple. No JVD present. No thyromegaly present.  Cardiovascular: Normal rate, regular rhythm and intact distal pulses.  Exam reveals no gallop and no friction rub.   No murmur heard. Respiratory: Effort normal and breath sounds normal. No respiratory distress. She has no wheezes. She has no rales.  GI: Soft. Bowel sounds are normal. She exhibits no distension. There is tenderness.  Musculoskeletal: Normal range of motion. She  exhibits no edema.  No arthritis, no gout  Lymphadenopathy:    She has no cervical adenopathy.  Neurological: She is alert and oriented to person, place, and time. No cranial nerve deficit.  No dysarthria, no aphasia  Skin: Skin is warm and dry. No rash noted. No erythema.  Psychiatric: She has a normal mood and affect. Her behavior is normal. Judgment and thought content normal.    LABORATORY PANEL:   CBC  Recent Labs Lab 07/11/16 1905  WBC 24.5*  HGB 13.9  HCT 41.3  PLT 182   ------------------------------------------------------------------------------------------------------------------  Chemistries   Recent Labs Lab 07/11/16 1905  NA 141  K 4.3  CL 102  CO2 28  GLUCOSE 177*  BUN 61*  CREATININE 3.01*  CALCIUM 9.6  AST 381*  ALT 282*  ALKPHOS 70  BILITOT 3.4*   ------------------------------------------------------------------------------------------------------------------  Cardiac Enzymes No results for input(s): TROPONINI in the last 168 hours. ------------------------------------------------------------------------------------------------------------------  RADIOLOGY:  Dg Chest 2 View  Result Date: 07/11/2016 CLINICAL DATA:  Sepsis EXAM: CHEST  2 VIEW COMPARISON:  07/02/2014 FINDINGS: Chronic cardiomegaly and aortic tortuosity. Low lung volumes with no focal airspace disease. No edema, effusion, or pneumothorax. IMPRESSION: Limited low volume chest without definitive pneumonia. Electronically Signed   By: Marnee SpringJonathon  Watts M.D.   On: 07/11/2016 19:09    EKG:   Orders placed or performed during the hospital encounter of 07/11/16  . ED EKG 12-Lead  . ED EKG 12-Lead    IMPRESSION AND PLAN:  Principal Problem:   Sepsis (HCC) - IV antibiotics started, lactic acid was elevated, we will treat with IV fluids and trend until within normal limits, blood pressure was borderline low, IV fluids for aggressive volume support, sepsis due to cholecystitis as  below Active Problems:   Pancreatitis - abdominal ultrasound shows a lot of gallstones in her gallbladder, as well as cholecystitis. Patient's common bile duct was not significantly dilated, though I suspect it is possible she passed the stone. We will keep her nothing by mouth, IV analgesia for pain, IV fluids as above, IV antibiotics as above for her cholecystitis   Cholecystitis - IV antibiotics as above, surgery consult at some point, though I strongly suspect this patient is not a very ideal surgical candidate   AKI (acute kidney injury) (HCC) - suspect from profound dehydration, IV fluids as above, avoid nephrotoxins   HTN (hypertension) - hold home antihypertensives for now as blood pressure is borderline low   HLD (hyperlipidemia) - continue home meds   Hypothyroidism - home dose thyroid replacement  All the records are reviewed and case discussed with ED provider. Management plans discussed with the patient and/or family.  DVT PROPHYLAXIS: Systemic anticoagulation  GI PROPHYLAXIS: None  ADMISSION STATUS: Inpatient  CODE STATUS: DO NOT RESUSCITATE Code Status History    Date Active Date Inactive Code Status Order ID Comments User Context   06/30/2014 11:05 AM 07/05/2014  8:10 PM Full Code [id number]  Arnaldo Nataliamond, Michael S, MD Inpatient    Advance Directive Documentation     Most Recent Value  Type of Advance Directive  Out of facility DNR (pink MOST or yellow form)  Pre-existing out of facility DNR order (yellow form or pink MOST form)  -  "MOST" Form in Place?  -      TOTAL TIME TAKING CARE OF THIS PATIENT: 45 minutes.   Destenie Ingber FIELDING 07/11/2016, 11:48 PM  [facility]  Office  [phone]  CC: Primary care physician; Patient, No Pcp Per  Note:  This document was prepared using Dragon voice recognition software and may include unintentional dictation errors.

## 2016-07-11 NOTE — ED Notes (Signed)
Attempted 2X with unsuccessful IV start. RN Celine MansSonja notified.

## 2016-07-11 NOTE — ED Notes (Signed)
Patient has a lot of redness on her skin at the brief area, crease of her belly, creases where legs are, redness on labia. Also has redness on her back/shoulder blades that blanches.

## 2016-07-12 ENCOUNTER — Inpatient Hospital Stay: Payer: Medicare Other

## 2016-07-12 DIAGNOSIS — N179 Acute kidney failure, unspecified: Secondary | ICD-10-CM | POA: Diagnosis present

## 2016-07-12 DIAGNOSIS — K819 Cholecystitis, unspecified: Secondary | ICD-10-CM

## 2016-07-12 LAB — LACTIC ACID, PLASMA
LACTIC ACID, VENOUS: 2.3 mmol/L — AB (ref 0.5–1.9)
Lactic Acid, Venous: 1.2 mmol/L (ref 0.5–1.9)

## 2016-07-12 LAB — COMPREHENSIVE METABOLIC PANEL
ALT: 223 U/L — ABNORMAL HIGH (ref 14–54)
ANION GAP: 9 (ref 5–15)
AST: 274 U/L — ABNORMAL HIGH (ref 15–41)
Albumin: 2.8 g/dL — ABNORMAL LOW (ref 3.5–5.0)
Alkaline Phosphatase: 61 U/L (ref 38–126)
BILIRUBIN TOTAL: 2.1 mg/dL — AB (ref 0.3–1.2)
BUN: 65 mg/dL — ABNORMAL HIGH (ref 6–20)
CHLORIDE: 108 mmol/L (ref 101–111)
CO2: 24 mmol/L (ref 22–32)
Calcium: 8.5 mg/dL — ABNORMAL LOW (ref 8.9–10.3)
Creatinine, Ser: 3.03 mg/dL — ABNORMAL HIGH (ref 0.44–1.00)
GFR calc Af Amer: 15 mL/min — ABNORMAL LOW (ref >60)
GFR, EST NON AFRICAN AMERICAN: 13 mL/min — AB (ref >60)
Glucose, Bld: 175 mg/dL — ABNORMAL HIGH (ref 65–99)
POTASSIUM: 4.4 mmol/L (ref 3.5–5.1)
Sodium: 141 mmol/L (ref 135–145)
TOTAL PROTEIN: 6 g/dL — AB (ref 6.5–8.1)

## 2016-07-12 LAB — GASTROINTESTINAL PANEL BY PCR, STOOL (REPLACES STOOL CULTURE)
ASTROVIRUS: NOT DETECTED
Adenovirus F40/41: NOT DETECTED
Campylobacter species: NOT DETECTED
Cryptosporidium: NOT DETECTED
Cyclospora cayetanensis: NOT DETECTED
ENTEROAGGREGATIVE E COLI (EAEC): NOT DETECTED
ENTEROTOXIGENIC E COLI (ETEC): NOT DETECTED
Entamoeba histolytica: NOT DETECTED
Enteropathogenic E coli (EPEC): NOT DETECTED
Giardia lamblia: NOT DETECTED
NOROVIRUS GI/GII: NOT DETECTED
Plesimonas shigelloides: NOT DETECTED
Rotavirus A: NOT DETECTED
SALMONELLA SPECIES: NOT DETECTED
SAPOVIRUS (I, II, IV, AND V): NOT DETECTED
SHIGA LIKE TOXIN PRODUCING E COLI (STEC): NOT DETECTED
SHIGELLA/ENTEROINVASIVE E COLI (EIEC): NOT DETECTED
Vibrio cholerae: NOT DETECTED
Vibrio species: NOT DETECTED
Yersinia enterocolitica: NOT DETECTED

## 2016-07-12 LAB — BLOOD CULTURE ID PANEL (REFLEXED)
Acinetobacter baumannii: NOT DETECTED
CANDIDA PARAPSILOSIS: NOT DETECTED
CANDIDA TROPICALIS: NOT DETECTED
CARBAPENEM RESISTANCE: NOT DETECTED
Candida albicans: NOT DETECTED
Candida glabrata: NOT DETECTED
Candida krusei: NOT DETECTED
ENTEROBACTERIACEAE SPECIES: DETECTED — AB
Enterobacter cloacae complex: NOT DETECTED
Enterococcus species: NOT DETECTED
Escherichia coli: DETECTED — AB
Haemophilus influenzae: NOT DETECTED
KLEBSIELLA OXYTOCA: NOT DETECTED
KLEBSIELLA PNEUMONIAE: NOT DETECTED
Listeria monocytogenes: NOT DETECTED
Neisseria meningitidis: NOT DETECTED
PSEUDOMONAS AERUGINOSA: NOT DETECTED
Proteus species: NOT DETECTED
STAPHYLOCOCCUS AUREUS BCID: NOT DETECTED
STREPTOCOCCUS PYOGENES: NOT DETECTED
Serratia marcescens: NOT DETECTED
Staphylococcus species: NOT DETECTED
Streptococcus agalactiae: NOT DETECTED
Streptococcus pneumoniae: NOT DETECTED
Streptococcus species: NOT DETECTED

## 2016-07-12 LAB — C DIFFICILE QUICK SCREEN W PCR REFLEX
C DIFFICILE (CDIFF) TOXIN: NEGATIVE
C DIFFICLE (CDIFF) ANTIGEN: NEGATIVE
C Diff interpretation: NOT DETECTED

## 2016-07-12 LAB — CBC
HEMATOCRIT: 39 % (ref 35.0–47.0)
Hemoglobin: 13.1 g/dL (ref 12.0–16.0)
MCH: 31.8 pg (ref 26.0–34.0)
MCHC: 33.5 g/dL (ref 32.0–36.0)
MCV: 95 fL (ref 80.0–100.0)
PLATELETS: 151 10*3/uL (ref 150–440)
RBC: 4.11 MIL/uL (ref 3.80–5.20)
RDW: 13.6 % (ref 11.5–14.5)
WBC: 19.8 10*3/uL — AB (ref 3.6–11.0)

## 2016-07-12 LAB — MRSA PCR SCREENING: MRSA BY PCR: NEGATIVE

## 2016-07-12 LAB — URINALYSIS, COMPLETE (UACMP) WITH MICROSCOPIC
BILIRUBIN URINE: NEGATIVE
GLUCOSE, UA: NEGATIVE mg/dL
KETONES UR: NEGATIVE mg/dL
NITRITE: NEGATIVE
PH: 5 (ref 5.0–8.0)
Protein, ur: 100 mg/dL — AB
Specific Gravity, Urine: 1.013 (ref 1.005–1.030)

## 2016-07-12 LAB — PROTIME-INR
INR: 1.87
PROTHROMBIN TIME: 21.8 s — AB (ref 11.4–15.2)

## 2016-07-12 LAB — GLUCOSE, CAPILLARY: GLUCOSE-CAPILLARY: 106 mg/dL — AB (ref 65–99)

## 2016-07-12 LAB — MAGNESIUM: MAGNESIUM: 1.6 mg/dL — AB (ref 1.7–2.4)

## 2016-07-12 LAB — PROCALCITONIN: Procalcitonin: 97.63 ng/mL

## 2016-07-12 LAB — PHOSPHORUS: Phosphorus: 3.7 mg/dL (ref 2.5–4.6)

## 2016-07-12 MED ORDER — CARVEDILOL 6.25 MG PO TABS
3.1250 mg | ORAL_TABLET | Freq: Two times a day (BID) | ORAL | Status: DC
  Administered 2016-07-12 – 2016-07-15 (×5): 3.125 mg via ORAL
  Filled 2016-07-12 (×6): qty 1

## 2016-07-12 MED ORDER — MORPHINE SULFATE (PF) 2 MG/ML IV SOLN: 2.0000 mg | INTRAVENOUS | Status: DC | PRN

## 2016-07-12 MED ORDER — HYDROMORPHONE HCL 1 MG/ML IJ SOLN
0.5000 mg | INTRAMUSCULAR | Status: DC | PRN
  Administered 2016-07-12: 0.5 mg via INTRAVENOUS
  Filled 2016-07-12: qty 1

## 2016-07-12 MED ORDER — OXYCODONE HCL 5 MG PO TABS: 5.0000 mg | ORAL_TABLET | ORAL | Status: DC | PRN

## 2016-07-12 MED ORDER — MAGNESIUM SULFATE IN D5W 1-5 GM/100ML-% IV SOLN
1.0000 g | Freq: Once | INTRAVENOUS | Status: DC
  Filled 2016-07-12: qty 100

## 2016-07-12 MED ORDER — ONDANSETRON HCL 4 MG PO TABS: 4.0000 mg | ORAL_TABLET | Freq: Four times a day (QID) | ORAL | Status: DC | PRN

## 2016-07-12 MED ORDER — LEVOTHYROXINE SODIUM 50 MCG PO TABS
25.0000 ug | ORAL_TABLET | Freq: Every day | ORAL | Status: DC
  Administered 2016-07-12 – 2016-07-15 (×4): 25 ug via ORAL
  Filled 2016-07-12 (×4): qty 1

## 2016-07-12 MED ORDER — SODIUM CHLORIDE 0.9 % IV SOLN
1.0000 g | Freq: Two times a day (BID) | INTRAVENOUS | Status: DC
  Administered 2016-07-12 – 2016-07-13 (×3): 1 g via INTRAVENOUS
  Filled 2016-07-12 (×8): qty 1

## 2016-07-12 MED ORDER — ORAL CARE MOUTH RINSE
15.0000 mL | Freq: Two times a day (BID) | OROMUCOSAL | Status: DC
  Administered 2016-07-12 – 2016-07-15 (×6): 15 mL via OROMUCOSAL

## 2016-07-12 MED ORDER — SODIUM CHLORIDE 0.9 % IV SOLN
INTRAVENOUS | Status: AC
  Administered 2016-07-12: 01:00:00 via INTRAVENOUS

## 2016-07-12 MED ORDER — VANCOMYCIN HCL IN DEXTROSE 1-5 GM/200ML-% IV SOLN
1000.0000 mg | INTRAVENOUS | Status: DC
  Filled 2016-07-12: qty 200

## 2016-07-12 MED ORDER — TIMOLOL MALEATE 0.5 % OP SOLN
1.0000 [drp] | Freq: Every day | OPHTHALMIC | Status: DC
  Administered 2016-07-12 – 2016-07-15 (×4): 1 [drp] via OPHTHALMIC
  Filled 2016-07-12: qty 5

## 2016-07-12 MED ORDER — VANCOMYCIN HCL IN DEXTROSE 1-5 GM/200ML-% IV SOLN
1000.0000 mg | Freq: Once | INTRAVENOUS | Status: AC
  Administered 2016-07-12: 1000 mg via INTRAVENOUS
  Filled 2016-07-12: qty 200

## 2016-07-12 MED ORDER — RIVAROXABAN 20 MG PO TABS: 20.0000 mg | ORAL_TABLET | Freq: Every day | ORAL | Status: DC

## 2016-07-12 MED ORDER — BRIMONIDINE TARTRATE-TIMOLOL 0.2-0.5 % OP SOLN
1.0000 [drp] | Freq: Every day | OPHTHALMIC | Status: DC
  Filled 2016-07-12: qty 5

## 2016-07-12 MED ORDER — ONDANSETRON HCL 4 MG/2ML IJ SOLN
4.0000 mg | Freq: Four times a day (QID) | INTRAMUSCULAR | Status: DC | PRN
  Administered 2016-07-13: 4 mg via INTRAVENOUS
  Filled 2016-07-12: qty 2

## 2016-07-12 MED ORDER — PIPERACILLIN-TAZOBACTAM 3.375 G IVPB
3.3750 g | Freq: Two times a day (BID) | INTRAVENOUS | Status: DC
  Filled 2016-07-12 (×2): qty 50

## 2016-07-12 MED ORDER — SODIUM CHLORIDE 0.9 % IV SOLN: INTRAVENOUS | Status: DC

## 2016-07-12 MED ORDER — ARIPIPRAZOLE 5 MG PO TABS
5.0000 mg | ORAL_TABLET | Freq: Every day | ORAL | Status: DC
  Administered 2016-07-12 – 2016-07-15 (×4): 5 mg via ORAL
  Filled 2016-07-12 (×4): qty 1

## 2016-07-12 MED ORDER — METRONIDAZOLE IN NACL 5-0.79 MG/ML-% IV SOLN
500.0000 mg | Freq: Three times a day (TID) | INTRAVENOUS | Status: DC
  Administered 2016-07-12: 500 mg via INTRAVENOUS
  Filled 2016-07-12 (×4): qty 100

## 2016-07-12 MED ORDER — LATANOPROST 0.005 % OP SOLN
1.0000 [drp] | Freq: Every day | OPHTHALMIC | Status: DC
  Administered 2016-07-12 – 2016-07-14 (×3): 1 [drp] via OPHTHALMIC
  Filled 2016-07-12 (×2): qty 2.5

## 2016-07-12 MED ORDER — BRIMONIDINE TARTRATE 0.2 % OP SOLN
1.0000 [drp] | Freq: Every day | OPHTHALMIC | Status: DC
  Administered 2016-07-12 – 2016-07-15 (×4): 1 [drp] via OPHTHALMIC
  Filled 2016-07-12: qty 5

## 2016-07-12 MED ORDER — RIVAROXABAN 15 MG PO TABS
15.0000 mg | ORAL_TABLET | Freq: Every day | ORAL | Status: DC
  Administered 2016-07-12: 15 mg via ORAL
  Filled 2016-07-12 (×2): qty 1

## 2016-07-12 MED ORDER — MOMETASONE FURO-FORMOTEROL FUM 100-5 MCG/ACT IN AERO
2.0000 | INHALATION_SPRAY | Freq: Two times a day (BID) | RESPIRATORY_TRACT | Status: DC
  Administered 2016-07-12 – 2016-07-15 (×7): 2 via RESPIRATORY_TRACT
  Filled 2016-07-12: qty 8.8

## 2016-07-12 NOTE — Plan of Care (Signed)
Problem: Safety: Goal: Ability to remain free from injury will improve Outcome: Progressing Call light within reach, bed in lowest position, IVs and monitor cables secured. Hourly rounding  Problem: Nutrition: Goal: Adequate nutrition will be maintained Outcome: Not Progressing Patient currently NPO

## 2016-07-12 NOTE — Progress Notes (Signed)
Pt report received.  Pt found resting in bed with eyes closed.  Alert to verbal stimuli.  Denies pain or need at this time.

## 2016-07-12 NOTE — Consult Note (Signed)
[facility]  CONSULT NOTE    Date: 07/12/2016                  Patient Name:  Kirsten Tran  MRN: [id number]  DOB: Jun 28, 1930  Age / Sex: 83 y.o., female         PCP: Patient, No Pcp Per                 Service Requesting Consult: Dr. Juliene PinaMody                 Reason for Consult: Acute renal failure on chronic kidney disease stage III            History of Present Illness: Kirsten Tran is a 83 y.o. white female with dementia, parkinson's, bipolar disorder with history of lithium, obstructive sleep apnea on CPAP, hyperlipidemia, hypertension, atrial fibrillation, osteopenia, CVA, who was admitted to [facility] on 07/11/2016 for Elevated LFTs [R79.89] Gallstone pancreatitis [K85.10] AKI (acute kidney injury) (HCC) [N17.9] Sepsis (HCC) [A41.9]  Patient unable to give much history. Urine culture pending. Diagnosed with sepsis with gram negative rods. Empirically on meropenem and vancomycin. Afebrile. Leukocytosis. Patient also with elevated lipase and LFTs.   Creatinine 3 with no improvement and minimal urine output, 300mL in foley catheter. Nephrology consulted.    Medications: Outpatient medications: Prescriptions Prior to Admission  Medication Sig Dispense Refill Last Dose  . acetaminophen (TYLENOL) 325 MG tablet Take 2 tablets (650 mg total) by mouth every 6 (six) hours as needed for mild pain (or Fever >/= 101).   unknown  . alendronate (FOSAMAX) 70 MG tablet Take 1 tablet by mouth every 30 (thirty) days.   unknown  . alum & mag hydroxide-simeth (MAALOX/MYLANTA) 200-200-20 MG/5ML suspension Take 30 mLs by mouth every 6 (six) hours as needed for indigestion or heartburn.   unknown  . ARIPiprazole (ABILIFY) 5 MG tablet Take 5 mg by mouth daily.   unknown  . Calcium-Vitamin D 600-200 MG-UNIT per tablet Take 1 tablet by mouth 2 (two) times daily.   unknown  . COMBIGAN 0.2-0.5 % ophthalmic solution Place 1 drop into both eyes daily.   unknown  . desonide  (DESOWEN) 0.05 % lotion Apply topically.   unknown  . Emollient (CERAVE) CREA Apply 1 application topically daily.   unknown  . enalapril (VASOTEC) 5 MG tablet Take 1 tablet by mouth daily.   unknown  . Fluticasone-Salmeterol (ADVAIR) 100-50 MCG/DOSE AEPB Inhale 1 puff into the lungs 2 (two) times daily.   unknown  . furosemide (LASIX) 20 MG tablet Take 1 tablet by mouth daily.   unknown  . ipratropium-albuterol (DUONEB) 0.5-2.5 (3) MG/3ML SOLN Inhale 3 mLs into the lungs every 6 (six) hours as needed.   unknown  . latanoprost (XALATAN) 0.005 % ophthalmic solution Place 1 drop into both eyes at bedtime.    unknown  . levothyroxine (SYNTHROID, LEVOTHROID) 25 MCG tablet Take 1 tablet by mouth daily.   unknown  . lithium carbonate 150 MG capsule Take 1 capsule by mouth at bedtime.   unknown  . metoprolol tartrate (LOPRESSOR) 25 MG tablet Take 1 tablet (25 mg total) by mouth 2 (two) times daily. 60 tablet 0 unknown  . Multiple Vitamins-Minerals (MULTIVITAMIN WITH MINERALS) tablet Take 1 tablet by mouth daily.   unknown  . omega-3 acid ethyl esters (LOVAZA) 1 G capsule Take 2 capsules by mouth 2 (two) times daily.   unknown  . polyethylene glycol powder (GLYCOLAX/MIRALAX) powder Take  1 Dose by mouth daily.   unknown  . pravastatin (PRAVACHOL) 20 MG tablet Take 1 tablet by mouth daily.   unknown  . rivaroxaban (XARELTO) 20 MG TABS tablet Take 1 tablet (20 mg total) by mouth daily. 30 tablet 0 unknown  . Vitamin D, Ergocalciferol, (DRISDOL) 50000 units CAPS capsule Take 50,000 Units by mouth every 30 (thirty) days.   unknown  . Lactobacillus (ACIDOPHILUS) CAPS capsule Take 2 capsules by mouth daily.   unknown    Current medications: Current Facility-Administered Medications  Medication Dose Route Frequency Provider Last Rate Last Dose  . ARIPiprazole (ABILIFY) tablet 5 mg  5 mg Oral Daily Oralia Manis, MD   5 mg at 07/12/16 1015  . brimonidine (ALPHAGAN) 0.2 % ophthalmic solution 1 drop  1 drop Both  Eyes Daily Oralia Manis, MD   1 drop at 07/12/16 1015   And  . timolol (TIMOPTIC) 0.5 % ophthalmic solution 1 drop  1 drop Both Eyes Daily Oralia Manis, MD   1 drop at 07/12/16 1015  . carvedilol (COREG) tablet 3.125 mg  3.125 mg Oral BID WC Mody, Sital, MD      . HYDROmorphone (DILAUDID) injection 0.5 mg  0.5 mg Intravenous Q4H PRN Marylou Flesher S, NP   0.5 mg at 07/12/16 0620  . latanoprost (XALATAN) 0.005 % ophthalmic solution 1 drop  1 drop Both Eyes QHS Oralia Manis, MD      . levothyroxine (SYNTHROID, LEVOTHROID) tablet 25 mcg  25 mcg Oral QAC breakfast Oralia Manis, MD   25 mcg at 07/12/16 1014  . magnesium sulfate IVPB 1 g 100 mL  1 g Intravenous Once Marylou Flesher S, NP      . MEDLINE mouth rinse  15 mL Mouth Rinse BID Oralia Manis, MD   15 mL at 07/12/16 1033  . meropenem (MERREM) 1 g in sodium chloride 0.9 % 100 mL IVPB  1 g Intravenous Q12H Hallaji, Sheema M, RPH      . mometasone-formoterol (DULERA) 100-5 MCG/ACT inhaler 2 puff  2 puff Inhalation BID Oralia Manis, MD   2 puff at 07/12/16 0800  . morphine 2 MG/ML injection 2 mg  2 mg Intravenous Q4H PRN Oralia Manis, MD      . ondansetron [facility]) tablet 4 mg  4 mg Oral Q6H PRN Oralia Manis, MD       Or  . ondansetron [facility]) injection 4 mg  4 mg Intravenous Q6H PRN Oralia Manis, MD      . oxyCODONE (Oxy IR/ROXICODONE) immediate release tablet 5 mg  5 mg Oral Q4H PRN Oralia Manis, MD      . Rivaroxaban [name]) tablet 15 mg  15 mg Oral Daily Oralia Manis, MD   15 mg at 07/12/16 1015      Allergies: No Known Allergies    Past Medical History: Past Medical History:  Diagnosis Date  . Atrial fibrillation, unspecified   . Dementia   . Essential hypertension   . Hypercholesteremia   . Insomnia   . Major depression, recurrent, chronic (HCC)   . Mood disorder (HCC)   . Parkinson disease (HCC)   . Renal disorder   . Renal insufficiency   . Schizoaffective disorder, bipolar type (HCC)   . Stroke [facility])       Past Surgical History: History reviewed. No pertinent surgical history.   Family History: Family History  Problem Relation Age of Onset  . Family history unknown: Yes     Social History: Social History  Social History  . Marital status: Divorced    Spouse name: N/A  . Number of children: N/A  . Years of education: N/A   Occupational History  . Not on file.   Social History Main Topics  . Smoking status: Never Smoker  . Smokeless tobacco: Never Used  . Alcohol use No  . Drug use: No  . Sexual activity: Not on file   Other Topics Concern  . Not on file   Social History Narrative  . No narrative on file     Review of Systems: Review of Systems  Unable to perform ROS: Dementia    Vital Signs: Blood pressure (!) 92/52, pulse (!) 113, temperature 98.4 F (36.9 C), temperature source Oral, resp. rate 16, height 5\' 3"  (1.6 m), weight 110.2 kg (242 lb 14.4 oz), SpO2 94 %.  Weight trends: Filed Weights   07/11/16 1736  Weight: 110.2 kg (242 lb 14.4 oz)    Physical Exam: General: Ill appearing  Head: Normocephalic, atraumatic. Dry oral mucosal membranes  Eyes: Anicteric, PERRL  Neck: Supple, trachea midline  Lungs:  Clear to auscultation  Heart: irregular  Abdomen:  Soft, nontender, obese  Extremities: no peripheral edema.  Neurologic: +tremor  Skin: No lesions  GU: Foley with urine     Lab results: Basic Metabolic Panel:  Recent Labs Lab 07/11/16 1905 07/12/16 0449  NA 141 141  K 4.3 4.4  CL 102 108  CO2 28 24  GLUCOSE 177* 175*  BUN 61* 65*  CREATININE 3.01* 3.03*  CALCIUM 9.6 8.5*  MG  --  1.6*  PHOS  --  3.7    Liver Function Tests:  Recent Labs Lab 07/11/16 1905 07/12/16 0449  AST 381* 274*  ALT 282* 223*  ALKPHOS 70 61  BILITOT 3.4* 2.1*  PROT 6.9 6.0*  ALBUMIN 3.2* 2.8*    Recent Labs Lab 07/11/16 1905  LIPASE 1,116*   No results for input(s): AMMONIA in the last 168 hours.  CBC:  Recent Labs Lab  07/11/16 1905 07/12/16 0449  WBC 24.5* 19.8*  NEUTROABS 22.5*  --   HGB 13.9 13.1  HCT 41.3 39.0  MCV 94.2 95.0  PLT 182 151    Cardiac Enzymes: No results for input(s): CKTOTAL, CKMB, CKMBINDEX, TROPONINI in the last 168 hours.  BNP: Invalid input(s): POCBNP  CBG:  Recent Labs Lab 07/12/16 0212  GLUCAP 106*    Microbiology: Results for orders placed or performed during the hospital encounter of 07/11/16  Blood Culture (routine x 2)     Status: None (Preliminary result)   Collection Time: 07/11/16  9:19 PM  Result Value Ref Range Status   Specimen Description BLOOD RIGHT ANTECUBITAL  Final   Special Requests   Final    BOTTLES DRAWN AEROBIC AND ANAEROBIC Blood Culture adequate volume   Culture  Setup Time   Final    Organism ID to follow IN BOTH AEROBIC AND ANAEROBIC BOTTLES GRAM NEGATIVE RODS CRITICAL RESULT CALLED TO, READ BACK BY AND VERIFIED WITH: SHEEMA HALLAJI ON 07/12/16 AT 1012 [facility]    Culture GRAM NEGATIVE RODS  Final   Report Status PENDING  Incomplete  Blood Culture (routine x 2)     Status: None (Preliminary result)   Collection Time: 07/11/16  9:19 PM  Result Value Ref Range Status   Specimen Description BLOOD LEFT ANTECUBITAL  Final   Special Requests   Final    BOTTLES DRAWN AEROBIC AND ANAEROBIC Blood Culture adequate volume   Culture  Setup Time   Final    Organism ID to follow IN BOTH AEROBIC AND ANAEROBIC BOTTLES GRAM NEGATIVE RODS CRITICAL RESULT CALLED TO, READ BACK BY AND VERIFIED WITH: SHEEMA HALLAJI ON 07/12/16 AT 1012 [facility]    Culture GRAM NEGATIVE RODS  Final   Report Status PENDING  Incomplete  Blood Culture ID Panel (Reflexed)     Status: Abnormal   Collection Time: 07/11/16  9:19 PM  Result Value Ref Range Status   Enterococcus species NOT DETECTED NOT DETECTED Final   Listeria monocytogenes NOT DETECTED NOT DETECTED Final   Staphylococcus species NOT DETECTED NOT DETECTED Final   Staphylococcus aureus NOT DETECTED NOT DETECTED  Final   Streptococcus species NOT DETECTED NOT DETECTED Final   Streptococcus agalactiae NOT DETECTED NOT DETECTED Final   Streptococcus pneumoniae NOT DETECTED NOT DETECTED Final   Streptococcus pyogenes NOT DETECTED NOT DETECTED Final   Acinetobacter baumannii NOT DETECTED NOT DETECTED Final   Enterobacteriaceae species DETECTED (A) NOT DETECTED Final    Comment: Enterobacteriaceae represent a large family of gram-negative bacteria, not a single organism. CRITICAL RESULT CALLED TO, READ BACK BY AND VERIFIED WITH: SHEEMA HALLAJI ON 07/12/16 [facility]    Enterobacter cloacae complex NOT DETECTED NOT DETECTED Final   Escherichia coli DETECTED (A) NOT DETECTED Final    Comment: CRITICAL RESULT CALLED TO, READ BACK BY AND VERIFIED WITH: SHEEMA HALLAJI ON 07/12/16 [facility]    Klebsiella oxytoca NOT DETECTED NOT DETECTED Final   Klebsiella pneumoniae NOT DETECTED NOT DETECTED Final   Proteus species NOT DETECTED NOT DETECTED Final   Serratia marcescens NOT DETECTED NOT DETECTED Final   Carbapenem resistance NOT DETECTED NOT DETECTED Final   Haemophilus influenzae NOT DETECTED NOT DETECTED Final   Neisseria meningitidis NOT DETECTED NOT DETECTED Final   Pseudomonas aeruginosa NOT DETECTED NOT DETECTED Final   Candida albicans NOT DETECTED NOT DETECTED Final   Candida glabrata NOT DETECTED NOT DETECTED Final   Candida krusei NOT DETECTED NOT DETECTED Final   Candida parapsilosis NOT DETECTED NOT DETECTED Final   Candida tropicalis NOT DETECTED NOT DETECTED Final  Gastrointestinal Panel by PCR , Stool     Status: None   Collection Time: 07/12/16 12:00 AM  Result Value Ref Range Status   Campylobacter species NOT DETECTED NOT DETECTED Final   Plesimonas shigelloides NOT DETECTED NOT DETECTED Final   Salmonella species NOT DETECTED NOT DETECTED Final   Yersinia enterocolitica NOT DETECTED NOT DETECTED Final   Vibrio species NOT DETECTED NOT DETECTED Final   Vibrio cholerae NOT DETECTED NOT DETECTED  Final   Enteroaggregative E coli (EAEC) NOT DETECTED NOT DETECTED Final   Enteropathogenic E coli (EPEC) NOT DETECTED NOT DETECTED Final   Enterotoxigenic E coli (ETEC) NOT DETECTED NOT DETECTED Final   Shiga like toxin producing E coli (STEC) NOT DETECTED NOT DETECTED Final   Shigella/Enteroinvasive E coli (EIEC) NOT DETECTED NOT DETECTED Final   Cryptosporidium NOT DETECTED NOT DETECTED Final   Cyclospora cayetanensis NOT DETECTED NOT DETECTED Final   Entamoeba histolytica NOT DETECTED NOT DETECTED Final   Giardia lamblia NOT DETECTED NOT DETECTED Final   Adenovirus F40/41 NOT DETECTED NOT DETECTED Final   Astrovirus NOT DETECTED NOT DETECTED Final   Norovirus GI/GII NOT DETECTED NOT DETECTED Final   Rotavirus A NOT DETECTED NOT DETECTED Final   Sapovirus (I, II, IV, and V) NOT DETECTED NOT DETECTED Final  C difficile quick scan w PCR reflex     Status: None   Collection Time: 07/12/16  12:00 AM  Result Value Ref Range Status   C Diff antigen NEGATIVE NEGATIVE Final   C Diff toxin NEGATIVE NEGATIVE Final   C Diff interpretation No C. difficile detected.  Final  MRSA PCR Screening     Status: None   Collection Time: 07/12/16  2:10 AM  Result Value Ref Range Status   MRSA by PCR NEGATIVE NEGATIVE Final    Comment:        The GeneXpert MRSA Assay (FDA approved for NASAL specimens only), is one component of a comprehensive MRSA colonization surveillance program. It is not intended to diagnose MRSA infection nor to guide or monitor treatment for MRSA infections.     Coagulation Studies:  Recent Labs  07/12/16 0449  LABPROT 21.8*  INR 1.87    Urinalysis:  Recent Labs  07/12/16 0715  COLORURINE AMBER*  LABSPEC 1.013  PHURINE 5.0  GLUCOSEU NEGATIVE  HGBUR LARGE*  BILIRUBINUR NEGATIVE  KETONESUR NEGATIVE  PROTEINUR 100*  NITRITE NEGATIVE  LEUKOCYTESUR LARGE*      Imaging: Dg Chest 2 View  Result Date: 07/11/2016 CLINICAL DATA:  Sepsis EXAM: CHEST  2 VIEW  COMPARISON:  07/02/2014 FINDINGS: Chronic cardiomegaly and aortic tortuosity. Low lung volumes with no focal airspace disease. No edema, effusion, or pneumothorax. IMPRESSION: Limited low volume chest without definitive pneumonia. Electronically Signed   By: Marnee Spring M.D.   On: 07/11/2016 19:09   US Abdomen Limited Ruq  Result Date: 07/11/2016 CLINICAL DATA:  Elevated LFTs, lipase and white count EXAM: ULTRASOUND ABDOMEN LIMITED RIGHT UPPER QUADRANT COMPARISON:  07/01/2014 FINDINGS: Gallbladder: Multiple shadowing stones within the gallbladder measuring up to 9 mm. Gallbladder wall thickening up to 5.7 mm. Positive sonographic Murphy. Common bile duct: Diameter: 3.7 mm Liver: Increased echogenicity consistent with fatty infiltration. Incidentally noted is cortical thinning of the right kidney with cysts measuring up to 1.5 cm. IMPRESSION: 1. Multiple gallstones with wall thickening and positive sonographic Murphy sign, findings are suspicious for an acute cholecystitis. No biliary dilatation 2. Echogenic liver consistent with fatty infiltration 3. Right renal cysts Electronically Signed   By: Jasmine Pang M.D.   On: 07/11/2016 23:53      Assessment & Plan: Kirsten Tran is a 83 y.o. white female with dementia, parkinson's, bipolar disorder with history of lithium, obstructive sleep apnea on CPAP, hyperlipidemia, hypertension, atrial fibrillation, osteopenia, CVA, who was admitted to [facility] on 07/11/2016 for Elevated LFTs [R79.89] Gallstone pancreatitis [K85.10] AKI (acute kidney injury) (HCC) [N17.9] Sepsis (HCC) [A41.9]  1. Acute renal failure on chronic kidney disease stage III: baseline creatinine of 0.99 GFR 55 06/2014 Acute renal failure with sepsis/urinary tract infection. Oliguric urine output.  - Agree with IV fluids: currently NS at 15mL/hr - holding home enalapril dose - monitor urine output, volume status, electrolytes and renal function. No acute indication for  dialysis - Renally dose all medications - check renal ultrasound  2. Sepsis with urinary tract infection and acute pancreatitis/cholecystitis: empiric vancomycin and meropenem.  - gram negative rods in urine culture.   3. Hypertension and Atrial fibrillation with rapid ventricular rhythm: on carvedilol. - rivaroxaban for anti-coagulation     LOS: 1 Barbarita Hutmacher 6/16/201812:50 PM

## 2016-07-12 NOTE — Progress Notes (Signed)
Pharmacy Antibiotic Note  Kirsten ForwardMarjorie J Tran is a 83 y.o. female admitted on 07/11/2016 with sepsis. BCID positive for E.Coli. Pharmacy has been consulted for Meropenem dosing.  Patient was receiving Vancomycin, Zosyn and Flagyl   Plan: Based on current renal function, will start patient on meropenem 1g IV every 12 hours.  Will monitor renal function and adjust dose as needed.   Height: 5\' 3"  (160 cm) Weight: 242 lb 14.4 oz (110.2 kg) IBW/kg (Calculated) : 52.4  Temp (24hrs), Avg:98.3 F (36.8 C), Min:97.9 F (36.6 C), Max:98.5 F (36.9 C)   Recent Labs Lab 07/11/16 1905 07/11/16 2358 07/12/16 0449  WBC 24.5*  --  19.8*  CREATININE 3.01*  --  3.03*  LATICACIDVEN 2.2* 2.3*  --     Estimated Creatinine Clearance: 16.5 mL/min (A) (by C-G formula based on SCr of 3.03 mg/dL (H)).    No Known Allergies  Antimicrobials this admission: 6/15 Zosyn >> 6/16 6/16 Metronidazole/Vancomcyin >> 6/16 6/16 meropenem >>  Dose adjustments this admission:   Microbiology results: 6/15 BCx: GNR 6/16 BCID: E.Coli 6/16 UCx: pending 6/15MRSA PCR: negative   Thank you for allowing pharmacy to be a part of this patient's care.  Gardner CandleSheema M Temiloluwa [place]ared[place], PharmD, BCPS Clinical Pharmacist 07/12/2016 10:41 AM

## 2016-07-12 NOTE — Progress Notes (Signed)
Rivaroxaban changed to 15 mg daily for TBW CrCl 24 mL/min and atrial fibrillation indication per MED 710-02.

## 2016-07-12 NOTE — Progress Notes (Addendum)
Pharmacy Antibiotic Note  Kirsten Tran is a 83 y.o. female admitted on 07/11/2016 with sepsis/intra-abdominal infection.  Pharmacy has been consulted for vancomycin and Zosyn dosing.  Plan: DW 75kg  VD 53L kei 0.019 hr-1  T1/2 36 hours Vancomycin 1 gram q 36 hours ordered with stacked dosing. Level before 4th dose. Goal trough 15-20.  Zosyn 3.375 grams q 12 hours ordered.  Height: 5\' 3"  (160 cm) Weight: 242 lb 14.4 oz (110.2 kg) IBW/kg (Calculated) : 52.4  Temp (24hrs), Avg:98.2 F (36.8 C), Min:97.9 F (36.6 C), Max:98.5 F (36.9 C)   Recent Labs Lab 07/11/16 1905 07/11/16 2358  WBC 24.5*  --   CREATININE 3.01*  --   LATICACIDVEN 2.2* 2.3*    Estimated Creatinine Clearance: 16.6 mL/min (A) (by C-G formula based on SCr of 3.01 mg/dL (H)).    No Known Allergies  Antimicrobials this admission: Vancomycin 6/16, Zosyn 6/15  >>    >>   Dose adjustments this admission:   Microbiology results: 6/15 BCx: pending 6/15 UCx: pending  6/15 MRSA PCR: pending      6/15 UA: pending 6/15 CXR: no definitive pneumonia Thank you for allowing pharmacy to be a part of this patient's care.  Khush Pasion S 07/12/2016 3:33 AM

## 2016-07-12 NOTE — Consult Note (Signed)
PULMONARY / CRITICAL CARE MEDICINE   Name: Kirsten Tran MRN: [id number] DOB: 11-15-1930    ADMISSION DATE:  07/11/2016   CONSULTATION DATE:  07/12/2016  REFERRING MD:  Dr Anne Hahn for sepsis.   REASON: Sepsis 2/2 cholecytitis  CHIEF COMPLAINT:  Weakness and abnormal labs  HISTORY OF PRESENT ILLNESS:   This is an 83 y/o female with an extensive past medical history as indicated below who presented to the ED with weakness, abnormal labs and abdominal pain from a SNF. Patient has baseline dementia hence history is obtained from ED records. Patient was recently discharged after hospitalization for urosepsis and pneumonia. Her diagnostic workup at the time also showed cholecystitis. She was discharged on oral levaquin. She is c/o abdominal pain, mourns and groans with any abdominal movement and position changes. Labs show elevated LFTs, lactic acidosis, leukocytosis and multiple gallstones with acute cholecystitis. Also noted to have profuse diarrhea. She is being admitted for acute gall stone pancreatitis, acute cholecystitis and sepsis  PAST MEDICAL HISTORY :  She  has a past medical history of Atrial fibrillation, unspecified; Dementia; Essential hypertension; Hypercholesteremia; Insomnia; Major depression, recurrent, chronic (HCC); Mood disorder (HCC); Parkinson disease (HCC); Renal disorder; Renal insufficiency; Schizoaffective disorder, bipolar type (HCC); and Stroke (HCC).  PAST SURGICAL HISTORY: She  has no past surgical history on file.  No Known Allergies  No current facility-administered medications on file prior to encounter.    Current Outpatient Prescriptions on File Prior to Encounter  Medication Sig  . acetaminophen (TYLENOL) 325 MG tablet Take 2 tablets (650 mg total) by mouth every 6 (six) hours as needed for mild pain (or Fever >/= 101).  Marland Kitchen alendronate (FOSAMAX) 70 MG tablet Take 1 tablet by mouth every 30 (thirty) days.  Marland Kitchen alum & mag hydroxide-simeth  (MAALOX/MYLANTA) 200-200-20 MG/5ML suspension Take 30 mLs by mouth every 6 (six) hours as needed for indigestion or heartburn.  . Calcium-Vitamin D 600-200 MG-UNIT per tablet Take 1 tablet by mouth 2 (two) times daily.  . COMBIGAN 0.2-0.5 % ophthalmic solution Place 1 drop into both eyes daily.  Marland Kitchen desonide (DESOWEN) 0.05 % lotion Apply topically.  . Emollient (CERAVE) CREA Apply 1 application topically daily.  . enalapril (VASOTEC) 5 MG tablet Take 1 tablet by mouth daily.  . furosemide (LASIX) 20 MG tablet Take 1 tablet by mouth daily.  Marland Kitchen ipratropium-albuterol (DUONEB) 0.5-2.5 (3) MG/3ML SOLN Inhale 3 mLs into the lungs every 6 (six) hours as needed.  . latanoprost (XALATAN) 0.005 % ophthalmic solution Place 1 drop into both eyes at bedtime.   Marland Kitchen levothyroxine (SYNTHROID, LEVOTHROID) 25 MCG tablet Take 1 tablet by mouth daily.  Marland Kitchen lithium carbonate 150 MG capsule Take 1 capsule by mouth at bedtime.  . metoprolol tartrate (LOPRESSOR) 25 MG tablet Take 1 tablet (25 mg total) by mouth 2 (two) times daily.  . Multiple Vitamins-Minerals (MULTIVITAMIN WITH MINERALS) tablet Take 1 tablet by mouth daily.  Marland Kitchen omega-3 acid ethyl esters (LOVAZA) 1 G capsule Take 2 capsules by mouth 2 (two) times daily.  . polyethylene glycol powder (GLYCOLAX/MIRALAX) powder Take 1 Dose by mouth daily.  . pravastatin (PRAVACHOL) 20 MG tablet Take 1 tablet by mouth daily.  . rivaroxaban (XARELTO) 20 MG TABS tablet Take 1 tablet (20 mg total) by mouth daily.  . Lactobacillus (ACIDOPHILUS) CAPS capsule Take 2 capsules by mouth daily.    FAMILY HISTORY:  Her has no family status information on file.    SOCIAL HISTORY: She  reports that she has  never smoked. She has never used smokeless tobacco. She reports that she does not drink alcohol or use drugs.  REVIEW OF SYSTEMS:   Limited-ANSWERS YES/NO Constitutional: Negative for fever and chills.  HENT: Negative for congestion and rhinorrhea.  Eyes: Negative for redness and  visual disturbance.  Respiratory: Negative for shortness of breath and wheezing.  Cardiovascular: Negative for chest pain and palpitations.  Gastrointestinal: Negative  for nausea , vomiting but positive for abdominal pain and  Loose stools Genitourinary: Negative for dysuria and urgency.  Endocrine: Denies polyuria, polyphagia and heat intolerance Musculoskeletal: Negative for myalgias and arthralgias.  Skin: Negative for pallor and wound.  Neurological: Negative for dizziness and headaches   SUBJECTIVE:   VITAL SIGNS: BP 97/77   Pulse (!) 130   Temp 98.5 F (36.9 C) (Oral)   Resp (!) 21   Ht 5\' 3"  (1.6 m)   Wt 242 lb 14.4 oz (110.2 kg)   SpO2 99%   BMI 43.03 kg/m   HEMODYNAMICS:    VENTILATOR SETTINGS:    INTAKE / OUTPUT: No intake/output data recorded.  PHYSICAL EXAMINATION: General: Moderate distress Neuro: AAOx 1, speech is normal, moves all extremities, memory impaired HEENT: PERRLA, oral mucosa dry, trachea midline Cardiovascular: irregular-irregular, no MRG, +2 edema, +2 pulses Lungs: Normal WOB, diminished breath sounds in the bases, coarse rhonchi in upper lung fields Abdomen:  Obese, + bowel sounds, pain with light palpation Musculoskeletal:  No deformities, unable to assess gait Skin: multiple excoriations in the perineum and sacrum  LABS:  BMET  Recent Labs Lab 07/11/16 1905  NA 141  K 4.3  CL 102  CO2 28  BUN 61*  CREATININE 3.01*  GLUCOSE 177*    Electrolytes  Recent Labs Lab 07/11/16 1905  CALCIUM 9.6    CBC  Recent Labs Lab 07/11/16 1905  WBC 24.5*  HGB 13.9  HCT 41.3  PLT 182    Coag's No results for input(s): APTT, INR in the last 168 hours.  Sepsis Markers  Recent Labs Lab 07/11/16 1905 07/11/16 2358  LATICACIDVEN 2.2* 2.3*    ABG No results for input(s): PHART, PCO2ART, PO2ART in the last 168 hours.  Liver Enzymes  Recent Labs Lab 07/11/16 1905  AST 381*  ALT 282*  ALKPHOS 70  BILITOT 3.4*   ALBUMIN 3.2*    Cardiac Enzymes No results for input(s): TROPONINI, PROBNP in the last 168 hours.  Glucose No results for input(s): GLUCAP in the last 168 hours.  Imaging Dg Chest 2 View  Result Date: 07/11/2016 CLINICAL DATA:  Sepsis EXAM: CHEST  2 VIEW COMPARISON:  07/02/2014 FINDINGS: Chronic cardiomegaly and aortic tortuosity. Low lung volumes with no focal airspace disease. No edema, effusion, or pneumothorax. IMPRESSION: Limited low volume chest without definitive pneumonia. Electronically Signed   By: Marnee Spring M.D.   On: 07/11/2016 19:09   US Abdomen Limited Ruq  Result Date: 07/11/2016 CLINICAL DATA:  Elevated LFTs, lipase and white count EXAM: ULTRASOUND ABDOMEN LIMITED RIGHT UPPER QUADRANT COMPARISON:  07/01/2014 FINDINGS: Gallbladder: Multiple shadowing stones within the gallbladder measuring up to 9 mm. Gallbladder wall thickening up to 5.7 mm. Positive sonographic Murphy. Common bile duct: Diameter: 3.7 mm Liver: Increased echogenicity consistent with fatty infiltration. Incidentally noted is cortical thinning of the right kidney with cysts measuring up to 1.5 cm. IMPRESSION: 1. Multiple gallstones with wall thickening and positive sonographic Murphy sign, findings are suspicious for an acute cholecystitis. No biliary dilatation 2. Echogenic liver consistent with fatty infiltration 3. Right  renal cysts Electronically Signed   By: Jasmine PangKim  Fujinaga M.D.   On: 07/11/2016 23:53    STUDIES:  Ultrasound 6/15 IMPRESSION: 1. Multiple gallstones with wall thickening and positive sonographic Murphy sign, findings are suspicious for an acute cholecystitis. No biliary dilatation 2. Echogenic liver consistent with fatty infiltration 3. Right renal cysts  CULTURES: Blood cultures x 2 Urine culture  ANTIBIOTICS: Flagyl Vancomycin Zosyn  SIGNIFICANT EVENTS: 06/15>admitted  LINES/TUBES: PIVs Foley  DISCUSSION: 83 y/o female presenting with gallstone pancreatitis, acute  cholecystitis and sepsis  ASSESSMENT  Acute cholecystitis Acute pancreatitis Sepsis 2/2 cholecystitis AKI H/o Atrial fibrillation H/O Hypertension H/O Hypothyroidism H/O Hyperlipidemia  PLAN IV antibiotics as abobve GI panel and c-diff IV fluids Cycle lactic acid and Procalcitonin Surgical consult Dilaudid for pain Keep NPO except for meds Continue xarelto Foley with strict Is/Os Urinalysis with reflex culture GI and DVT prophylaxis   FAMILY  - Updates: No family at bedside. Will update when available  - Inter-disciplinary family meet or Palliative Care meeting due by:  day 7   [facility] [place] ANP-BC Pulmonary and Critical Care Medicine [facility] Pager [phone] or [phone]  07/12/2016, 2:06 AM   STAFF NOTE: I. Dr. Nicholos Johnsamachandran, have personally reviewed the patient's available data including medical history , events of notes, physican examination and test results as part of my evaluation. I have discussed with the  Care with the NP and other care providers including  pharmacist, ICU RN, RRT, dietary.  Physical Exam  Constitutional: No distress.  Eyes: Pupils are equal, round, and reactive to light.  Neck: Normal range of motion.  Cardiovascular: Normal rate.   Pulmonary/Chest: Effort normal and breath sounds normal.   Acute cholangitis with septic shock and ARF, E.coli growing in blood. D/w pharmacist, will change abx to merrem, continue fluids. Will monitor resp status.   Wells Guileseep Sheyann Sulton, MD.   Board Certified in Internal Medicine, Pulmonary Medicine, Critical Care Medicine, and Sleep Medicine.  [facility] Office Number: [phone] Pager: [phone]  Santiago Gladavid Kasa, M.D.  Billy Fischeravid Simonds, M.D 07/12/2016

## 2016-07-12 NOTE — Consult Note (Signed)
Patient ID: Kirsten Tran, female   DOB: 1930/10/31, 83 y.o.   MRN: [id number]  CC: Weakness  HPI Kirsten Tran is a 83 y.o. female who is currently admitted to the ICU for generalized weakness, abdominal pain, abnormal labs. Surgery consultation requested by Dr. Juliene Pina for evaluation of possible cholecystitis. Patient has a history of dementia and is unable to contribute much to the history. Majority of the history obtained from the chart. Patient had an admission in 2016 for which she had a urinary tract infection, pneumonia, possible cholecystitis. She improved on antibody therapy and no surgical intervention was undertaken. Patient has a known history of Parkinson's disease, dementia, schizoaffective disorder, atrial fibrillation, hypertension, hyperlipidemia and presented with a 2 day history of generalized weakness and decreased oral intake. She was found to have a leukocytosis as well as evidence of pancreatitis. No reported history of fevers, chills, nausea, vomiting, diarrhea. Patient is currently without complaints only asked she can have a drink of water.  HPI  Past Medical History:  Diagnosis Date  . Atrial fibrillation, unspecified   . Dementia   . Essential hypertension   . Hypercholesteremia   . Insomnia   . Major depression, recurrent, chronic (HCC)   . Mood disorder (HCC)   . Parkinson disease (HCC)   . Renal disorder   . Renal insufficiency   . Schizoaffective disorder, bipolar type (HCC)   . Stroke [facility])     History reviewed. No pertinent surgical history.  Family History  Problem Relation Age of Onset  . Family history unknown: Yes    Social History Social History  Substance Use Topics  . Smoking status: Never Smoker  . Smokeless tobacco: Never Used  . Alcohol use No    No Known Allergies  Current Facility-Administered Medications  Medication Dose Route Frequency Provider Last Rate Last Dose  . ARIPiprazole (ABILIFY) tablet 5 mg  5 mg Oral Daily  Oralia Manis, MD   5 mg at 07/12/16 1015  . brimonidine (ALPHAGAN) 0.2 % ophthalmic solution 1 drop  1 drop Both Eyes Daily Oralia Manis, MD   1 drop at 07/12/16 1015   And  . timolol (TIMOPTIC) 0.5 % ophthalmic solution 1 drop  1 drop Both Eyes Daily Oralia Manis, MD   1 drop at 07/12/16 1015  . carvedilol (COREG) tablet 3.125 mg  3.125 mg Oral BID WC Mody, Sital, MD      . HYDROmorphone (DILAUDID) injection 0.5 mg  0.5 mg Intravenous Q4H PRN Marylou Flesher S, NP   0.5 mg at 07/12/16 0620  . latanoprost (XALATAN) 0.005 % ophthalmic solution 1 drop  1 drop Both Eyes QHS Oralia Manis, MD      . levothyroxine (SYNTHROID, LEVOTHROID) tablet 25 mcg  25 mcg Oral QAC breakfast Oralia Manis, MD   25 mcg at 07/12/16 1014  . magnesium sulfate IVPB 1 g 100 mL  1 g Intravenous Once Marylou Flesher S, NP      . MEDLINE mouth rinse  15 mL Mouth Rinse BID Oralia Manis, MD   15 mL at 07/12/16 1033  . meropenem (MERREM) 1 g in sodium chloride 0.9 % 100 mL IVPB  1 g Intravenous Q12H Hallaji, Sheema M, RPH      . mometasone-formoterol (DULERA) 100-5 MCG/ACT inhaler 2 puff  2 puff Inhalation BID Oralia Manis, MD   2 puff at 07/12/16 0800  . morphine 2 MG/ML injection 2 mg  2 mg Intravenous Q4H PRN Oralia Manis, MD      .  ondansetron (ZOFRAN) tablet 4 mg  4 mg Oral Q6H PRN Oralia ManisWillis, David, MD       Or  . ondansetron [facility](ZOFRAN) injection 4 mg  4 mg Intravenous Q6H PRN Oralia ManisWillis, David, MD      . oxyCODONE (Oxy IR/ROXICODONE) immediate release tablet 5 mg  5 mg Oral Q4H PRN Oralia ManisWillis, David, MD      . Rivaroxaban [name](XARELTO) tablet 15 mg  15 mg Oral Daily Oralia ManisWillis, David, MD   15 mg at 07/12/16 1015     Review of Systems A Multi-point review of systems was asked and was negative except for the findings documented in the history of present illness  Physical Exam Blood pressure (!) 92/52, pulse (!) 113, temperature 98.4 F (36.9 C), temperature source Oral, resp. rate 16, height 5\' 3"  (1.6 m), weight 110.2 kg (242  lb 14.4 oz), SpO2 94 %. CONSTITUTIONAL: Resting in bed in no acute distress. EYES: Pupils are equal, round, and reactive to light, Sclera are non-icteric. EARS, NOSE, MOUTH AND THROAT: The oropharynx is clear. The oral mucosa is pink and moist. Hearing is intact to voice. LYMPH NODES:  Lymph nodes in the neck are normal. RESPIRATORY:  Lungs are clear. There is normal respiratory effort, with equal breath sounds bilaterally, and without pathologic use of accessory muscles. CARDIOVASCULAR: Heart is tachycardic. GI: The abdomen is large, soft, questionably tender but not reproducible on exam, and nondistended. There are no palpable masses. There are normal bowel sounds in all quadrants. GU: Rectal deferred.   MUSCULOSKELETAL: No cyanosis or edema.   SKIN: Turgor is good and there are no obvious pathologic skin lesions or ulcers. NEUROLOGIC: Motor and sensation is grossly normal. Cranial nerves are grossly intact. PSYCH:   Affect is normal.  Data Reviewed Images and labs reviewed. Labs showed leukocytosis currently at 19.8 which is down from 24.5. On admission showed a lipase of over 1100. Elevated transaminases currently in the 200s down from the 300s. Total bilirubin is 2.1 down from 3.4. Elevated creatinine of 3.03. Patient also has evidence of urinary tract infection on her urinalysis. Ultrasound the right upper quadrant shows gallstones, questionable gallbladder wall thickening and a reported positive sonographic Murphy sign. I have personally reviewed the patient's imaging, laboratory findings and medical records.    Assessment    Sepsis    Plan    83 year old female with what appears to be cholecystitis in the setting of sepsis. Also with evidence of urinary tract infection. Patient does have numerous other medical problems though to make operative intervention unnecessarily risky. Primarily her anticoagulation for A. fib. At this point patient is a very poor surgical candidate. Would  recommend bridging her anticoagulation and continuing antibiotic therapy. Should she fail to show continued improvement a percutaneous cholecystostomy tube would be entertained. However I would continue to treat medically for an additional 24-48 hours prior to putting this only woman through any procedure. The Gen. surgery service will continue to follow with you.     Time spent with the patient was 80 minutes, with more than 50% of the time spent in face-to-face education, counseling and care coordination.     Ricarda Frameharles Skarlett Sedlacek, MD FACS General Surgeon 07/12/2016, 12:38 PM

## 2016-07-12 NOTE — Progress Notes (Signed)
Report called to Floor RN.  Denies questions.  Pt to be transported via bed.

## 2016-07-12 NOTE — Progress Notes (Signed)
[facility] [place] [facility]   PATIENT NAME: Kirsten Tran    MR#:  [id number]  DATE OF BIRTH:  11/12/1930  SUBJECTIVE:   Patient presents with generalized weakness She reports that she is hungry She denies abdominal pain  REVIEW OF SYSTEMS:   Unable to obtain due to dementia   Tolerating Diet: npo      DRUG ALLERGIES:  No Known Allergies  VITALS:  Blood pressure (!) 92/52, pulse (!) 113, temperature 98.4 F (36.9 C), temperature source Oral, resp. rate 16, height 5\' 3"  (1.6 m), weight 110.2 kg (242 lb 14.4 oz), SpO2 94 %.  PHYSICAL EXAMINATION:  Constitutional: Appears well-developed and ill appearing HENT: Normocephalic. Marland Kitchen Oropharynx is clear and moist.  Eyes: Conjunctivae and EOM are normal. PERRLA, no scleral icterus.  Neck: Normal ROM. Neck supple. No JVD. No tracheal deviation. CVS: RRR, S1/S2 +, no murmurs, no gallops, no carotid bruit.  Pulmonary: Effort and breath sounds normal, no stridor, rhonchi, wheezes, rales.  Abdominal: Soft. BS +,  no distension, tenderness, rebound or guarding.  Musculoskeletal: Normal range of motion. No edema and no tenderness.  Neuro: Alert. CN 2-12 grossly intact. No focal deficits. Skin: Skin is warm and dry. No rash noted. Psychiatric: Normal mood and affect.      LABORATORY PANEL:   CBC  Recent Labs Lab 07/12/16 0449  WBC 19.8*  HGB 13.1  HCT 39.0  PLT 151   ------------------------------------------------------------------------------------------------------------------  Chemistries   Recent Labs Lab 07/12/16 0449  NA 141  K 4.4  CL 108  CO2 24  GLUCOSE 175*  BUN 65*  CREATININE 3.03*  CALCIUM 8.5*  MG 1.6*  AST 274*  ALT 223*  ALKPHOS 61  BILITOT 2.1*   ------------------------------------------------------------------------------------------------------------------  Cardiac Enzymes No results for input(s): TROPONINI in the last 168  hours. ------------------------------------------------------------------------------------------------------------------  RADIOLOGY:  Dg Chest 2 View  Result Date: 07/11/2016 CLINICAL DATA:  Sepsis EXAM: CHEST  2 VIEW COMPARISON:  07/02/2014 FINDINGS: Chronic cardiomegaly and aortic tortuosity. Low lung volumes with no focal airspace disease. No edema, effusion, or pneumothorax. IMPRESSION: Limited low volume chest without definitive pneumonia. Electronically Signed   By: Marnee Spring M.D.   On: 07/11/2016 19:09   US Abdomen Limited Ruq  Result Date: 07/11/2016 CLINICAL DATA:  Elevated LFTs, lipase and white count EXAM: ULTRASOUND ABDOMEN LIMITED RIGHT UPPER QUADRANT COMPARISON:  07/01/2014 FINDINGS: Gallbladder: Multiple shadowing stones within the gallbladder measuring up to 9 mm. Gallbladder wall thickening up to 5.7 mm. Positive sonographic Murphy. Common bile duct: Diameter: 3.7 mm Liver: Increased echogenicity consistent with fatty infiltration. Incidentally noted is cortical thinning of the right kidney with cysts measuring up to 1.5 cm. IMPRESSION: 1. Multiple gallstones with wall thickening and positive sonographic Murphy sign, findings are suspicious for an acute cholecystitis. No biliary dilatation 2. Echogenic liver consistent with fatty infiltration 3. Right renal cysts Electronically Signed   By: Jasmine Pang M.D.   On: 07/11/2016 23:53     ASSESSMENT AND PLAN:   83 year old female with Parkinson's disease and dementia who presents with generalized weakness and found of sepsis from acute cholecystitis  1. Sepsis: She presents with elevated lactic acid, leukocytosis, tachycardia  Blood cultures are positive for gram-negative rods, Escherichia coli Continue meropenem  2. Acute cholecystitis: Surgery evaluation. Patient was recently admitted for similar issue and was found to be not a surgical candidate. Nothing by mouth for now Continue meropenem  3. Atrial fibrillation and  RVR: Blood pressure low normal Consult  cardiology on a signed Continue Xarelto Add low-dose Coreg if blood pressure can tolerate this.  4. Acute kidney injury in the setting of sepsis Consult nephrology Hold nephrotoxic agents  5. Elevated LFTs due to acute cholecystitis Repeat in a.m.  6. Leukocytosis: Slowly resolving this is due to sepsis.  7. Chronic respiratory failure on 2 L of oxygen   Management plans discussed with nursingt.  CODE STATUS: dnr  TOTAL TIME TAKING CARE OF THIS PATIENT: 30 minutes.     POSSIBLE D/C 2-4 days, DEPENDING ON CLINICAL CONDITION.   Florina Glas M.D on 07/12/2016 at 11:18 AM  Between 7am to 6pm - Pager - [phone] After 6pm go to [email or web address] - password [email or web address]  [facility]  Office  [phone]  CC: Primary care physician; Patient, No Pcp Per  Note: This dictation was prepared with Dragon dictation along with smaller phrase technology. Any transcriptional errors that result from this process are unintentional.

## 2016-07-12 NOTE — Progress Notes (Signed)
Pt downstairs to Ultrasound at this time via Transporter.  NAD noted.  Pt verbalized understanding.

## 2016-07-12 NOTE — Consult Note (Signed)
[facility] CLINIC CARDIOLOGY A [facility] PRACTICE  CARDIOLOGY CONSULT NOTE  Patient ID: Kirsten Tran MRN: [id number] DOB/AGE: Jan 10, 1931 83 y.o.  Admit date: 07/11/2016 Referring Physician Dr. Juliene Pina  Primary Physician   Primary Cardiologist   Reason for Consultation afib with hypotension  HPI: Pt is a 83 yo female with history of afib, dementia, hyperlipidemia, parinson disease, renal insuffiency who was admited with dx of sepsis. She was transferred to the icu for treatment of hypotension and increased vr of her afib. Her vr increased to 122, and her sbp was in the 80-90 rnage. She is currently in afib with vr of 105. SBP is 90. She is on broad spctrum abx for her sepsis. She is not a historian due to dementia. She is currently on carvedilol as outpatient.  Review of Systems  Unable to perform ROS: Dementia    Past Medical History:  Diagnosis Date  . Atrial fibrillation, unspecified   . Dementia   . Essential hypertension   . Hypercholesteremia   . Insomnia   . Major depression, recurrent, chronic (HCC)   . Mood disorder (HCC)   . Parkinson disease (HCC)   . Renal disorder   . Renal insufficiency   . Schizoaffective disorder, bipolar type (HCC)   . Stroke [facility])     Family History  Problem Relation Age of Onset  . Family history unknown: Yes    Social History   Social History  . Marital status: Divorced    Spouse name: N/A  . Number of children: N/A  . Years of education: N/A   Occupational History  . Not on file.   Social History Main Topics  . Smoking status: Never Smoker  . Smokeless tobacco: Never Used  . Alcohol use No  . Drug use: No  . Sexual activity: Not on file   Other Topics Concern  . Not on file   Social History Narrative  . No narrative on file    History reviewed. No pertinent surgical history.   Prescriptions Prior to Admission  Medication Sig Dispense Refill Last Dose  . acetaminophen (TYLENOL) 325 MG tablet Take 2  tablets (650 mg total) by mouth every 6 (six) hours as needed for mild pain (or Fever >/= 101).   unknown  . alendronate (FOSAMAX) 70 MG tablet Take 1 tablet by mouth every 30 (thirty) days.   unknown  . alum & mag hydroxide-simeth (MAALOX/MYLANTA) 200-200-20 MG/5ML suspension Take 30 mLs by mouth every 6 (six) hours as needed for indigestion or heartburn.   unknown  . ARIPiprazole (ABILIFY) 5 MG tablet Take 5 mg by mouth daily.   unknown  . Calcium-Vitamin D 600-200 MG-UNIT per tablet Take 1 tablet by mouth 2 (two) times daily.   unknown  . COMBIGAN 0.2-0.5 % ophthalmic solution Place 1 drop into both eyes daily.   unknown  . desonide (DESOWEN) 0.05 % lotion Apply topically.   unknown  . Emollient (CERAVE) CREA Apply 1 application topically daily.   unknown  . enalapril (VASOTEC) 5 MG tablet Take 1 tablet by mouth daily.   unknown  . Fluticasone-Salmeterol (ADVAIR) 100-50 MCG/DOSE AEPB Inhale 1 puff into the lungs 2 (two) times daily.   unknown  . furosemide (LASIX) 20 MG tablet Take 1 tablet by mouth daily.   unknown  . ipratropium-albuterol (DUONEB) 0.5-2.5 (3) MG/3ML SOLN Inhale 3 mLs into the lungs every 6 (six) hours as needed.   unknown  . latanoprost (XALATAN) 0.005 % ophthalmic solution  Place 1 drop into both eyes at bedtime.    unknown  . levothyroxine (SYNTHROID, LEVOTHROID) 25 MCG tablet Take 1 tablet by mouth daily.   unknown  . lithium carbonate 150 MG capsule Take 1 capsule by mouth at bedtime.   unknown  . metoprolol tartrate (LOPRESSOR) 25 MG tablet Take 1 tablet (25 mg total) by mouth 2 (two) times daily. 60 tablet 0 unknown  . Multiple Vitamins-Minerals (MULTIVITAMIN WITH MINERALS) tablet Take 1 tablet by mouth daily.   unknown  . omega-3 acid ethyl esters (LOVAZA) 1 G capsule Take 2 capsules by mouth 2 (two) times daily.   unknown  . polyethylene glycol powder (GLYCOLAX/MIRALAX) powder Take 1 Dose by mouth daily.   unknown  . pravastatin (PRAVACHOL) 20 MG tablet Take 1 tablet  by mouth daily.   unknown  . rivaroxaban (XARELTO) 20 MG TABS tablet Take 1 tablet (20 mg total) by mouth daily. 30 tablet 0 unknown  . Vitamin D, Ergocalciferol, (DRISDOL) 50000 units CAPS capsule Take 50,000 Units by mouth every 30 (thirty) days.   unknown  . Lactobacillus (ACIDOPHILUS) CAPS capsule Take 2 capsules by mouth daily.   unknown    Physical Exam: Blood pressure (!) 92/52, pulse (!) 113, temperature 98.4 F (36.9 C), temperature source Oral, resp. rate 16, height 5\' 3"  (1.6 m), weight 110.2 kg (242 lb 14.4 oz), SpO2 94 %.   Wt Readings from Last 1 Encounters:  07/11/16 110.2 kg (242 lb 14.4 oz)     General appearance: alert and cooperative Resp: clear to auscultation bilaterally Cardio: irregularly irregular rhythm GI: soft, non-tender; bowel sounds normal; no masses,  no organomegaly Extremities: extremities normal, atraumatic, no cyanosis or edema Neurologic: Mental status: alertness: not orianted to person place or time  Labs:   Lab Results  Component Value Date   WBC 19.8 (H) 07/12/2016   HGB 13.1 07/12/2016   HCT 39.0 07/12/2016   MCV 95.0 07/12/2016   PLT 151 07/12/2016    Recent Labs Lab 07/12/16 0449  NA 141  K 4.4  CL 108  CO2 24  BUN 65*  CREATININE 3.03*  CALCIUM 8.5*  PROT 6.0*  BILITOT 2.1*  ALKPHOS 61  ALT 223*  AST 274*  GLUCOSE 175*   Lab Results  Component Value Date   TROPONINI 0.06 (H) 06/30/2014      Radiology: No pulmonary edema or pneumonia EKG: afib with increased vr  ASSESSMENT AND PLAN:  83 yo female with history of chornic afib, dementia, Who was admitted with increasing malaise and weakness. She had a recent admission for sepsis in the past. He presented with complaints of abdominal discomfort. She was felt to have recurrent sepsis. Patient regarding atrial fibrillation with increased ventricular response was carried out. Patient is on Xarelto for anticoagulation as an outpatient. This has been discontinued due to pending  possible surgical intervention. She remains on carvedilol at 3.125 mg twice daily, with fairly good rate control. She was transiently hypotensive. She is on broad-spectrum antibiotics. Would agree with remaining off of chronic anticoagulation at present due to pending possible surgical intervention. Will continue to follow. Signed: Dalia HeadingKenneth A Rainn Zupko MD, [facility] Hospital[facility]ACC 07/12/2016, 2:23 PM

## 2016-07-12 NOTE — Progress Notes (Signed)
Magdalene, NP notified of bladder scan results and pt's inability to void.  Urine culture pending sample collection. Orders for foley catheter received. Foley placed with Madelaine BhatAdam, RN and sample sent to lab.   Report given to Madelaine BhatAdam, RN

## 2016-07-12 NOTE — Progress Notes (Signed)
  PHARMACY - PHYSICIAN COMMUNICATION CRITICAL VALUE ALERT - BLOOD CULTURE IDENTIFICATION (BCID)  Results for orders placed or performed during the hospital encounter of 07/11/16  Blood Culture ID Panel (Reflexed) (Collected: 07/11/2016  9:19 PM)  Result Value Ref Range   Enterococcus species NOT DETECTED NOT DETECTED   Listeria monocytogenes NOT DETECTED NOT DETECTED   Staphylococcus species NOT DETECTED NOT DETECTED   Staphylococcus aureus NOT DETECTED NOT DETECTED   Streptococcus species NOT DETECTED NOT DETECTED   Streptococcus agalactiae NOT DETECTED NOT DETECTED   Streptococcus pneumoniae NOT DETECTED NOT DETECTED   Streptococcus pyogenes NOT DETECTED NOT DETECTED   Acinetobacter baumannii NOT DETECTED NOT DETECTED   Enterobacteriaceae species DETECTED (A) NOT DETECTED   Enterobacter cloacae complex NOT DETECTED NOT DETECTED   Escherichia coli DETECTED (A) NOT DETECTED   Klebsiella oxytoca NOT DETECTED NOT DETECTED   Klebsiella pneumoniae NOT DETECTED NOT DETECTED   Proteus species NOT DETECTED NOT DETECTED   Serratia marcescens NOT DETECTED NOT DETECTED   Carbapenem resistance NOT DETECTED NOT DETECTED   Haemophilus influenzae NOT DETECTED NOT DETECTED   Neisseria meningitidis NOT DETECTED NOT DETECTED   Pseudomonas aeruginosa NOT DETECTED NOT DETECTED   Candida albicans NOT DETECTED NOT DETECTED   Candida glabrata NOT DETECTED NOT DETECTED   Candida krusei NOT DETECTED NOT DETECTED   Candida parapsilosis NOT DETECTED NOT DETECTED   Candida tropicalis NOT DETECTED NOT DETECTED    Name of physician (or Provider) Contacted: Dr. Nicholos Johnsamachandran  Changes to prescribed antibiotics required: Discontinue current Abx and start meropenem.   Gardner CandleSheema M Emersyn Kotarski, PharmD, BCPS Clinical Pharmacist 07/12/2016 10:36 AM

## 2016-07-13 ENCOUNTER — Inpatient Hospital Stay: Payer: Medicare Other

## 2016-07-13 LAB — CBC
HEMATOCRIT: 37.6 % (ref 35.0–47.0)
HEMOGLOBIN: 12.7 g/dL (ref 12.0–16.0)
MCH: 32.5 pg (ref 26.0–34.0)
MCHC: 33.7 g/dL (ref 32.0–36.0)
MCV: 96.4 fL (ref 80.0–100.0)
Platelets: 124 10*3/uL — ABNORMAL LOW (ref 150–440)
RBC: 3.9 MIL/uL (ref 3.80–5.20)
RDW: 13.6 % (ref 11.5–14.5)
WBC: 14.9 10*3/uL — AB (ref 3.6–11.0)

## 2016-07-13 LAB — COMPREHENSIVE METABOLIC PANEL
ALK PHOS: 79 U/L (ref 38–126)
ALT: 142 U/L — ABNORMAL HIGH (ref 14–54)
AST: 110 U/L — AB (ref 15–41)
Albumin: 2.5 g/dL — ABNORMAL LOW (ref 3.5–5.0)
Anion gap: 7 (ref 5–15)
BILIRUBIN TOTAL: 1.1 mg/dL (ref 0.3–1.2)
BUN: 70 mg/dL — AB (ref 6–20)
CALCIUM: 8.1 mg/dL — AB (ref 8.9–10.3)
CO2: 23 mmol/L (ref 22–32)
CREATININE: 3.16 mg/dL — AB (ref 0.44–1.00)
Chloride: 110 mmol/L (ref 101–111)
GFR calc Af Amer: 14 mL/min — ABNORMAL LOW (ref >60)
GFR, EST NON AFRICAN AMERICAN: 13 mL/min — AB (ref >60)
GLUCOSE: 122 mg/dL — AB (ref 65–99)
POTASSIUM: 4.2 mmol/L (ref 3.5–5.1)
Sodium: 140 mmol/L (ref 135–145)
TOTAL PROTEIN: 5.9 g/dL — AB (ref 6.5–8.1)

## 2016-07-13 LAB — PROCALCITONIN: Procalcitonin: 56.62 ng/mL

## 2016-07-13 LAB — APTT
aPTT: 37 seconds — ABNORMAL HIGH (ref 24–36)
aPTT: 56 seconds — ABNORMAL HIGH (ref 24–36)

## 2016-07-13 LAB — HEPARIN LEVEL (UNFRACTIONATED): HEPARIN UNFRACTIONATED: 3.5 [IU]/mL — AB (ref 0.30–0.70)

## 2016-07-13 MED ORDER — HEPARIN (PORCINE) IN NACL 100-0.45 UNIT/ML-% IJ SOLN
1250.0000 [IU]/h | INTRAMUSCULAR | Status: DC
  Administered 2016-07-13: 1150 [IU]/h via INTRAVENOUS
  Filled 2016-07-13 (×2): qty 250

## 2016-07-13 NOTE — Progress Notes (Signed)
[employer] - [place] at [facility]   PATIENT NAME: Kirsten Tran    MR#:  [id number]  DATE OF BIRTH:  1930-02-16  SUBJECTIVE:   Patient with confusion this am REVIEW OF SYSTEMS:   Unable to obtain due to dementia   Tolerating Diet: npo      DRUG ALLERGIES:  No Known Allergies  VITALS:  Blood pressure 105/66, pulse (!) 109, temperature 97.8 F (36.6 C), temperature source Oral, resp. rate 19, height 5\' 3"  (1.6 m), weight 110.2 kg (242 lb 14.4 oz), SpO2 96 %.  PHYSICAL EXAMINATION:  Constitutional: Appears well-developed and ill appearing HENT: Normocephalic. Marland Kitchen Oropharynx is clear and moist.  Eyes: Conjunctivae and EOM are normal. PERRLA, no scleral icterus.  Neck: Normal ROM. Neck supple. No JVD. No tracheal deviation. CVS: RRR, S1/S2 +, no murmurs, no gallops, no carotid bruit.  Pulmonary: Effort and breath sounds normal, no stridor, rhonchi, wheezes, rales.  Abdominal: Soft. BS +,  no distension, tenderness, rebound or guarding.  Musculoskeletal: Normal range of motion. No edema and no tenderness.  Neuro: Alert. CN 2-12 grossly intact. No focal deficits. Skin: Skin is warm and dry. No rash noted. Psychiatric: Normal mood and affect.      LABORATORY PANEL:   CBC  Recent Labs Lab 07/13/16 0537  WBC 14.9*  HGB 12.7  HCT 37.6  PLT 124*   ------------------------------------------------------------------------------------------------------------------  Chemistries   Recent Labs Lab 07/12/16 0449 07/13/16 0537  NA 141 140  K 4.4 4.2  CL 108 110  CO2 24 23  GLUCOSE 175* 122*  BUN 65* 70*  CREATININE 3.03* 3.16*  CALCIUM 8.5* 8.1*  MG 1.6*  --   AST 274* 110*  ALT 223* 142*  ALKPHOS 61 79  BILITOT 2.1* 1.1   ------------------------------------------------------------------------------------------------------------------  Cardiac Enzymes No results for input(s): TROPONINI in the last 168  hours. ------------------------------------------------------------------------------------------------------------------  RADIOLOGY:  Dg Chest 1 View  Result Date: 07/13/2016 CLINICAL DATA:  Dyspnea. EXAM: CHEST 1 VIEW COMPARISON:  07/11/2016 FINDINGS: Shallow lung inflation. Heart enlarged. Prominent pulmonary artery segment is stable. Patient is rotated. There are no focal consolidations or pleural effusions. No evidence for pulmonary edema. Perihilar bronchitic changes are stable. IMPRESSION: 1. Shallow inflation. 2. Cardiomegaly without pulmonary edema. Electronically Signed   By: Norva Pavlov M.D.   On: 07/13/2016 08:40   Dg Chest 2 View  Result Date: 07/11/2016 CLINICAL DATA:  Sepsis EXAM: CHEST  2 VIEW COMPARISON:  07/02/2014 FINDINGS: Chronic cardiomegaly and aortic tortuosity. Low lung volumes with no focal airspace disease. No edema, effusion, or pneumothorax. IMPRESSION: Limited low volume chest without definitive pneumonia. Electronically Signed   By: Marnee Spring M.D.   On: 07/11/2016 19:09   US Renal  Result Date: 07/12/2016 CLINICAL DATA:  Acute renal failure. EXAM: RENAL / URINARY TRACT ULTRASOUND COMPLETE COMPARISON:  CT of the abdomen and pelvis on 07/01/2014 FINDINGS: Right Kidney: Length: 11.8 cm. Cyst in the midpole region is 3.3 x 3.1 x 3.8 cm. Renal parenchyma is normal echogenicity. No hydronephrosis or solid renal mass appear Left Kidney: Length: 13.0 cm.   No focal renal mass or hydronephrosis. Bladder: The bladder is decompressed by a Foley catheter. IMPRESSION: 1. Right renal cyst. 2. No hydronephrosis. 3. Foley catheter Electronically Signed   By: Norva Pavlov M.D.   On: 07/12/2016 18:29   US Abdomen Limited Ruq  Result Date: 07/11/2016 CLINICAL DATA:  Elevated LFTs, lipase and white count EXAM: ULTRASOUND ABDOMEN LIMITED RIGHT UPPER QUADRANT COMPARISON:  07/01/2014 FINDINGS:  Gallbladder: Multiple shadowing stones within the gallbladder measuring up to 9 mm.  Gallbladder wall thickening up to 5.7 mm. Positive sonographic Murphy. Common bile duct: Diameter: 3.7 mm Liver: Increased echogenicity consistent with fatty infiltration. Incidentally noted is cortical thinning of the right kidney with cysts measuring up to 1.5 cm. IMPRESSION: 1. Multiple gallstones with wall thickening and positive sonographic Murphy sign, findings are suspicious for an acute cholecystitis. No biliary dilatation 2. Echogenic liver consistent with fatty infiltration 3. Right renal cysts Electronically Signed   By: Jasmine PangKim  Fujinaga M.D.   On: 07/11/2016 23:53     ASSESSMENT AND PLAN:   83 year old female with Parkinson's disease and dementia who presents with generalized weakness and found of sepsis from acute cholecystitis  1. Escherichia coli Sepsis from gallbladder source: She presents with elevated lactic acid, leukocytosis, tachycardia  Blood cultures are positive for Escherichia coli Continue meropenem  2. Acute cholecystitis: Surgery evaluation appreciated.  Patient is poor surgical candidate  Seems to be slowly improving   3. Atrial fibrillation : Heart rate better controlled with low-dose Coreg Holding oral anticoagulation just in case patient needs surgery Patient is on heparin drip   4. Acute kidney injury in the setting of sepsis Creatinine slightly elevated troponin this morning. Renal ultrasound without hydronephrosis  5. Elevated LFTs due to acute cholecystitis This is improving continue to monitor.  6. Leukocytosis: Slowly resolving this is due to sepsis.  7. Chronic respiratory failure on 2 L of oxygen   Management plans discussed with nursing.  CODE STATUS: dnr  TOTAL TIME TAKING CARE OF THIS PATIENT: 24 minutes.     POSSIBLE D/C 2-4 days, DEPENDING ON CLINICAL CONDITION.   Analeah Brame M.D on 07/13/2016 at 10:24 AM  Between 7am to 6pm - Pager - [phone] After 6pm go to [email or web address] - password [email or web address]  [facility]   Office  [phone]  CC: Primary care physician; Patient, No Pcp Per  Note: This dictation was prepared with Dragon dictation along with smaller phrase technology. Any transcriptional errors that result from this process are unintentional.

## 2016-07-13 NOTE — Progress Notes (Signed)
[facility]  ROUNDING NOTE   Subjective:   More alert and oriented this morning.  UOP 1250 Creatinine 3.16 (3.03) Off IV fluids, started on clear liquid diet.  meropenem  Objective:  Vital signs in last 24 hours:  Temp:  [97.8 F (36.6 C)-98.5 F (36.9 C)] 98.1 F (36.7 C) (06/17 1229) Pulse Rate:  [78-109] 85 (06/17 1229) Resp:  [17-33] 17 (06/17 1229) BP: (98-116)/(49-66) 116/56 (06/17 1229) SpO2:  [94 %-99 %] 99 % (06/17 1229)  Weight change:  Filed Weights   07/11/16 1736  Weight: 110.2 kg (242 lb 14.4 oz)    Intake/Output: I/O last 3 completed shifts: In: 3916.7 [I.V.:616.7; IV Piggyback:3300] Out: 1250 [Urine:1250]   Intake/Output this shift:  Total I/O In: 120 [P.O.:120] Out: 750 [Urine:750]  Physical Exam: General: NAD, laying in bed  Head: Normocephalic, atraumatic. Dry oral mucosal membranes  Eyes: Anicteric, PERRL  Neck: Supple, trachea midline  Lungs:  Clear to auscultation  Heart: Regular rate and rhythm  Abdomen:  Soft, nontender,   Extremities: no peripheral edema.  Neurologic: Oriented to self, following commands  Skin: No lesions       Basic Metabolic Panel:  Recent Labs Lab 07/11/16 1905 07/12/16 0449 07/13/16 0537  NA 141 141 140  K 4.3 4.4 4.2  CL 102 108 110  CO2 28 24 23   GLUCOSE 177* 175* 122*  BUN 61* 65* 70*  CREATININE 3.01* 3.03* 3.16*  CALCIUM 9.6 8.5* 8.1*  MG  --  1.6*  --   PHOS  --  3.7  --     Liver Function Tests:  Recent Labs Lab 07/11/16 1905 07/12/16 0449 07/13/16 0537  AST 381* 274* 110*  ALT 282* 223* 142*  ALKPHOS 70 61 79  BILITOT 3.4* 2.1* 1.1  PROT 6.9 6.0* 5.9*  ALBUMIN 3.2* 2.8* 2.5*    Recent Labs Lab 07/11/16 1905  LIPASE 1,116*   No results for input(s): AMMONIA in the last 168 hours.  CBC:  Recent Labs Lab 07/11/16 1905 07/12/16 0449 07/13/16 0537  WBC 24.5* 19.8* 14.9*  NEUTROABS 22.5*  --   --   HGB 13.9 13.1 12.7  HCT 41.3 39.0 37.6  MCV 94.2 95.0  96.4  PLT 182 151 124*    Cardiac Enzymes: No results for input(s): CKTOTAL, CKMB, CKMBINDEX, TROPONINI in the last 168 hours.  BNP: Invalid input(s): POCBNP  CBG:  Recent Labs Lab 07/12/16 0212  GLUCAP 106*    Microbiology: Results for orders placed or performed during the hospital encounter of 07/11/16  Blood Culture (routine x 2)     Status: Abnormal (Preliminary result)   Collection Time: 07/11/16  9:19 PM  Result Value Ref Range Status   Specimen Description BLOOD RIGHT ANTECUBITAL  Final   Special Requests   Final    BOTTLES DRAWN AEROBIC AND ANAEROBIC Blood Culture adequate volume   Culture  Setup Time   Final    IN BOTH AEROBIC AND ANAEROBIC BOTTLES GRAM NEGATIVE RODS CRITICAL RESULT CALLED TO, READ BACK BY AND VERIFIED WITH: SHEEMA HALLAJI ON 07/12/16 AT 1012 SRC    Culture (A)  Final    ESCHERICHIA COLI SUSCEPTIBILITIES TO FOLLOW Performed at [facility], [address] [place], [place] [address]    Report Status PENDING  Incomplete  Blood Culture (routine x 2)     Status: None (Preliminary result)   Collection Time: 07/11/16  9:19 PM  Result Value Ref Range Status   Specimen Description BLOOD LEFT  ANTECUBITAL  Final   Special Requests   Final    BOTTLES DRAWN AEROBIC AND ANAEROBIC Blood Culture adequate volume   Culture  Setup Time   Final    IN BOTH AEROBIC AND ANAEROBIC BOTTLES GRAM NEGATIVE RODS CRITICAL RESULT CALLED TO, READ BACK BY AND VERIFIED WITH: SHEEMA HALLAJI ON 07/12/16 AT 1012 [facility] Performed at [facility], [address] [place], [place] [address]    Culture GRAM NEGATIVE RODS  Final   Report Status PENDING  Incomplete  Blood Culture ID Panel (Reflexed)     Status: Abnormal   Collection Time: 07/11/16  9:19 PM  Result Value Ref Range Status   Enterococcus species NOT DETECTED NOT DETECTED Final   Listeria monocytogenes NOT DETECTED NOT DETECTED Final   Staphylococcus species NOT DETECTED NOT DETECTED Final    Staphylococcus aureus NOT DETECTED NOT DETECTED Final   Streptococcus species NOT DETECTED NOT DETECTED Final   Streptococcus agalactiae NOT DETECTED NOT DETECTED Final   Streptococcus pneumoniae NOT DETECTED NOT DETECTED Final   Streptococcus pyogenes NOT DETECTED NOT DETECTED Final   Acinetobacter baumannii NOT DETECTED NOT DETECTED Final   Enterobacteriaceae species DETECTED (A) NOT DETECTED Final    Comment: Enterobacteriaceae represent a large family of gram-negative bacteria, not a single organism. CRITICAL RESULT CALLED TO, READ BACK BY AND VERIFIED WITH: SHEEMA HALLAJI ON 07/12/16 SRC    Enterobacter cloacae complex NOT DETECTED NOT DETECTED Final   Escherichia coli DETECTED (A) NOT DETECTED Final    Comment: CRITICAL RESULT CALLED TO, READ BACK BY AND VERIFIED WITH: SHEEMA HALLAJI ON 07/12/16 SRC    Klebsiella oxytoca NOT DETECTED NOT DETECTED Final   Klebsiella pneumoniae NOT DETECTED NOT DETECTED Final   Proteus species NOT DETECTED NOT DETECTED Final   Serratia marcescens NOT DETECTED NOT DETECTED Final   Carbapenem resistance NOT DETECTED NOT DETECTED Final   Haemophilus influenzae NOT DETECTED NOT DETECTED Final   Neisseria meningitidis NOT DETECTED NOT DETECTED Final   Pseudomonas aeruginosa NOT DETECTED NOT DETECTED Final   Candida albicans NOT DETECTED NOT DETECTED Final   Candida glabrata NOT DETECTED NOT DETECTED Final   Candida krusei NOT DETECTED NOT DETECTED Final   Candida parapsilosis NOT DETECTED NOT DETECTED Final   Candida tropicalis NOT DETECTED NOT DETECTED Final  Gastrointestinal Panel by PCR , Stool     Status: None   Collection Time: 07/12/16 12:00 AM  Result Value Ref Range Status   Campylobacter species NOT DETECTED NOT DETECTED Final   Plesimonas shigelloides NOT DETECTED NOT DETECTED Final   Salmonella species NOT DETECTED NOT DETECTED Final   Yersinia enterocolitica NOT DETECTED NOT DETECTED Final   Vibrio species NOT DETECTED NOT DETECTED Final    Vibrio cholerae NOT DETECTED NOT DETECTED Final   Enteroaggregative E coli (EAEC) NOT DETECTED NOT DETECTED Final   Enteropathogenic E coli (EPEC) NOT DETECTED NOT DETECTED Final   Enterotoxigenic E coli (ETEC) NOT DETECTED NOT DETECTED Final   Shiga like toxin producing E coli (STEC) NOT DETECTED NOT DETECTED Final   Shigella/Enteroinvasive E coli (EIEC) NOT DETECTED NOT DETECTED Final   Cryptosporidium NOT DETECTED NOT DETECTED Final   Cyclospora cayetanensis NOT DETECTED NOT DETECTED Final   Entamoeba histolytica NOT DETECTED NOT DETECTED Final   Giardia lamblia NOT DETECTED NOT DETECTED Final   Adenovirus F40/41 NOT DETECTED NOT DETECTED Final   Astrovirus NOT DETECTED NOT DETECTED Final   Norovirus GI/GII NOT DETECTED NOT DETECTED Final   Rotavirus A NOT DETECTED  NOT DETECTED Final   Sapovirus (I, II, IV, and V) NOT DETECTED NOT DETECTED Final  C difficile quick scan w PCR reflex     Status: None   Collection Time: 07/12/16 12:00 AM  Result Value Ref Range Status   C Diff antigen NEGATIVE NEGATIVE Final   C Diff toxin NEGATIVE NEGATIVE Final   C Diff interpretation No C. difficile detected.  Final  MRSA PCR Screening     Status: None   Collection Time: 07/12/16  2:10 AM  Result Value Ref Range Status   MRSA by PCR NEGATIVE NEGATIVE Final    Comment:        The GeneXpert MRSA Assay (FDA approved for NASAL specimens only), is one component of a comprehensive MRSA colonization surveillance program. It is not intended to diagnose MRSA infection nor to guide or monitor treatment for MRSA infections.   Urine culture     Status: Abnormal (Preliminary result)   Collection Time: 07/12/16  7:15 AM  Result Value Ref Range Status   Specimen Description URINE, RANDOM  Final   Special Requests NONE  Final   Culture (A)  Final    >=100,000 COLONIES/mL ESCHERICHIA COLI SUSCEPTIBILITIES TO FOLLOW Performed at [facility], [address] [place], [place] [address]     Report Status PENDING  Incomplete    Coagulation Studies:  Recent Labs  07/12/16 0449  LABPROT 21.8*  INR 1.87    Urinalysis:  Recent Labs  07/12/16 0715  COLORURINE AMBER*  LABSPEC 1.013  PHURINE 5.0  GLUCOSEU NEGATIVE  HGBUR LARGE*  BILIRUBINUR NEGATIVE  KETONESUR NEGATIVE  PROTEINUR 100*  NITRITE NEGATIVE  LEUKOCYTESUR LARGE*      Imaging: Dg Chest 1 View  Result Date: 07/13/2016 CLINICAL DATA:  Dyspnea. EXAM: CHEST 1 VIEW COMPARISON:  07/11/2016 FINDINGS: Shallow lung inflation. Heart enlarged. Prominent pulmonary artery segment is stable. Patient is rotated. There are no focal consolidations or pleural effusions. No evidence for pulmonary edema. Perihilar bronchitic changes are stable. IMPRESSION: 1. Shallow inflation. 2. Cardiomegaly without pulmonary edema. Electronically Signed   By: Norva Pavlov M.D.   On: 07/13/2016 08:40   Dg Chest 2 View  Result Date: 07/11/2016 CLINICAL DATA:  Sepsis EXAM: CHEST  2 VIEW COMPARISON:  07/02/2014 FINDINGS: Chronic cardiomegaly and aortic tortuosity. Low lung volumes with no focal airspace disease. No edema, effusion, or pneumothorax. IMPRESSION: Limited low volume chest without definitive pneumonia. Electronically Signed   By: Marnee Spring M.D.   On: 07/11/2016 19:09   US Renal  Result Date: 07/12/2016 CLINICAL DATA:  Acute renal failure. EXAM: RENAL / URINARY TRACT ULTRASOUND COMPLETE COMPARISON:  CT of the abdomen and pelvis on 07/01/2014 FINDINGS: Right Kidney: Length: 11.8 cm. Cyst in the midpole region is 3.3 x 3.1 x 3.8 cm. Renal parenchyma is normal echogenicity. No hydronephrosis or solid renal mass appear Left Kidney: Length: 13.0 cm.   No focal renal mass or hydronephrosis. Bladder: The bladder is decompressed by a Foley catheter. IMPRESSION: 1. Right renal cyst. 2. No hydronephrosis. 3. Foley catheter Electronically Signed   By: Norva Pavlov M.D.   On: 07/12/2016 18:29   US Abdomen Limited Ruq  Result Date:  07/11/2016 CLINICAL DATA:  Elevated LFTs, lipase and white count EXAM: ULTRASOUND ABDOMEN LIMITED RIGHT UPPER QUADRANT COMPARISON:  07/01/2014 FINDINGS: Gallbladder: Multiple shadowing stones within the gallbladder measuring up to 9 mm. Gallbladder wall thickening up to 5.7 mm. Positive sonographic Murphy. Common bile duct: Diameter: 3.7 mm Liver: Increased echogenicity  consistent with fatty infiltration. Incidentally noted is cortical thinning of the right kidney with cysts measuring up to 1.5 cm. IMPRESSION: 1. Multiple gallstones with wall thickening and positive sonographic Murphy sign, findings are suspicious for an acute cholecystitis. No biliary dilatation 2. Echogenic liver consistent with fatty infiltration 3. Right renal cysts Electronically Signed   By: Jasmine Pang M.D.   On: 07/11/2016 23:53     Medications:   . heparin 1,150 Units/hr (07/13/16 1322)  . magnesium sulfate 1 - 4 g bolus IVPB    . meropenem (MERREM) IV 1 g (07/13/16 1212)   . ARIPiprazole  5 mg Oral Daily  . brimonidine  1 drop Both Eyes Daily   And  . timolol  1 drop Both Eyes Daily  . carvedilol  3.125 mg Oral BID WC  . latanoprost  1 drop Both Eyes QHS  . levothyroxine  25 mcg Oral QAC breakfast  . mouth rinse  15 mL Mouth Rinse BID  . mometasone-formoterol  2 puff Inhalation BID   HYDROmorphone (DILAUDID) injection, morphine injection, ondansetron **OR** ondansetron (ZOFRAN) IV, oxyCODONE  Assessment/ Plan:  Ms. Kirsten Tran is a 83 y.o.  female Ms. Kirsten Tran is a 83 y.o. white female with dementia, parkinson's, bipolar disorder with history of lithium, obstructive sleep apnea on CPAP, hyperlipidemia, hypertension, atrial fibrillation, osteopenia, CVA, who was admitted to [facility] on 07/11/2016 for gallstone pancreatitis  1. Acute renal failure on chronic kidney disease stage III: baseline creatinine of 0.99 GFR 55 06/2014 Acute renal failure with sepsis/urinary tract infection. Oliguric urine  output.  - Off IV fluids, encourage PO intake.  - holding home enalapril dose - monitor urine output, volume status, electrolytes and renal function. No acute indication for dialysis - Renally dose all medications  2. Sepsis with urinary tract infection and acute pancreatitis/cholecystitis: empiric vancomycin and meropenem.  - Appreciate surgery input - gram negative rods in blood culture. Recent E. Coli UTI with sepsis on 6/3  3. Hypertension and Atrial fibrillation with rapid ventricular rhythm: on carvedilol. - rivaroxaban for anti-coagulation   LOS: 2 Dashawn Golda 6/17/20181:34 PM

## 2016-07-13 NOTE — Progress Notes (Addendum)
ANTICOAGULATION CONSULT NOTE - Initial Consult  Pharmacy Consult for Heparin  Indication: atrial fibrillation  No Known Allergies  Patient Measurements: Height: 5\' 3"  (160 cm) Weight: 242 lb 14.4 oz (110.2 kg) IBW/kg (Calculated) : 52.4 Heparin Dosing Weight: 79kg   Vital Signs: Temp: 98.3 F (36.8 C) (06/17 2031) Temp Source: Oral (06/17 2031) BP: 136/90 (06/17 2031) Pulse Rate: 115 (06/17 2031)  Labs:  Recent Labs  07/11/16 1905 07/12/16 0449 07/13/16 0537 07/13/16 1141 07/13/16 2126  HGB 13.9 13.1 12.7  --   --   HCT 41.3 39.0 37.6  --   --   PLT 182 151 124*  --   --   APTT  --   --   --  37* 56*  LABPROT  --  21.8*  --   --   --   INR  --  1.87  --   --   --   HEPARINUNFRC  --   --   --  3.50*  --   CREATININE 3.01* 3.03* 3.16*  --   --     Estimated Creatinine Clearance: 15.8 mL/min (A) (by C-G formula based on SCr of 3.16 mg/dL (H)).   Medical History: Past Medical History:  Diagnosis Date  . Atrial fibrillation, unspecified   . Dementia   . Essential hypertension   . Hypercholesteremia   . Insomnia   . Major depression, recurrent, chronic (HCC)   . Mood disorder (HCC)   . Parkinson disease (HCC)   . Renal disorder   . Renal insufficiency   . Schizoaffective disorder, bipolar type (HCC)   . Stroke [facility](HCC)     Assessment: 83 yo female with PMH of A.Fib, admitted with Sepsis. Pharmacy consulted for heparin dosing and monitoring. Patient takes Rivaroxaban 15mg  outpatient, but oral anticoagulation is being held incase patient needs surgery. Last dose of Rivaroxaban  was  6/16 at 1015.   Goal of Therapy:  Heparin level 0.3-0.7 units/ml aPTT 66-102 seconds Monitor platelets by anticoagulation protocol: Yes   Plan:  Baseline aPTT, PT/INR, and HL ordered.  Will not bolus since patient was taking Rivaroxaban. Start heparin infusion at 1150 units/hr Will need to adjust heparin based on aPTT levels until aPTT and anti-Xa levels correlate.  Check aPTT  and Anti-Xa daily while on heparin Continue to monitor H&H and platelets  6/17 21:30 aPTT 56. Increase rate to 1250 units/hr and recheck in 6 hours.  6/18 AM aPTT 68, heparin level 1.06. Continue current regimen. Recheck aPTT, heparin level, and CBC with tomorrow AM labs.  Erich MontaneMcBane,Carissa Musick S, PharmD, BCPS Clinical Pharmacist 07/13/2016 10:35 PM

## 2016-07-13 NOTE — Progress Notes (Signed)
CC: Cholecystitis Subjective: Patient is pleasantly confused this morning. States she's not having any pain is feeling fine and just wants something to drink.  Objective: Vital signs in last 24 hours: Temp:  [97.8 F (36.6 C)-98.6 F (37 C)] 97.8 F (36.6 C) (06/17 0428) Pulse Rate:  [78-109] 109 (06/17 0428) Resp:  [19-33] 19 (06/17 0428) BP: (94-116)/(49-66) 105/66 (06/17 0428) SpO2:  [94 %-98 %] 96 % (06/17 0428) Last BM Date: 07/12/16  Intake/Output from previous day: 06/16 0701 - 06/17 0700 In: 0  Out: 1250 [Urine:1250] Intake/Output this shift: Total I/O In: -  Out: 550 [Urine:550]  Physical exam:  Gen.: No acute distress Chest: Clear to auscultation Heart: Tachycardic Abdomen: Large, soft, nontender  Lab Results: CBC   Recent Labs  07/12/16 0449 07/13/16 0537  WBC 19.8* 14.9*  HGB 13.1 12.7  HCT 39.0 37.6  PLT 151 124*   BMET  Recent Labs  07/12/16 0449 07/13/16 0537  NA 141 140  K 4.4 4.2  CL 108 110  CO2 24 23  GLUCOSE 175* 122*  BUN 65* 70*  CREATININE 3.03* 3.16*  CALCIUM 8.5* 8.1*   PT/INR  Recent Labs  07/12/16 0449  LABPROT 21.8*  INR 1.87   ABG No results for input(s): PHART, HCO3 in the last 72 hours.  Invalid input(s): PCO2, PO2  Studies/Results: Dg Chest 1 View  Result Date: 07/13/2016 CLINICAL DATA:  Dyspnea. EXAM: CHEST 1 VIEW COMPARISON:  07/11/2016 FINDINGS: Shallow lung inflation. Heart enlarged. Prominent pulmonary artery segment is stable. Patient is rotated. There are no focal consolidations or pleural effusions. No evidence for pulmonary edema. Perihilar bronchitic changes are stable. IMPRESSION: 1. Shallow inflation. 2. Cardiomegaly without pulmonary edema. Electronically Signed   By: Norva Pavlov M.D.   On: 07/13/2016 08:40   Dg Chest 2 View  Result Date: 07/11/2016 CLINICAL DATA:  Sepsis EXAM: CHEST  2 VIEW COMPARISON:  07/02/2014 FINDINGS: Chronic cardiomegaly and aortic tortuosity. Low lung volumes with  no focal airspace disease. No edema, effusion, or pneumothorax. IMPRESSION: Limited low volume chest without definitive pneumonia. Electronically Signed   By: Marnee Spring M.D.   On: 07/11/2016 19:09   US Renal  Result Date: 07/12/2016 CLINICAL DATA:  Acute renal failure. EXAM: RENAL / URINARY TRACT ULTRASOUND COMPLETE COMPARISON:  CT of the abdomen and pelvis on 07/01/2014 FINDINGS: Right Kidney: Length: 11.8 cm. Cyst in the midpole region is 3.3 x 3.1 x 3.8 cm. Renal parenchyma is normal echogenicity. No hydronephrosis or solid renal mass appear Left Kidney: Length: 13.0 cm.   No focal renal mass or hydronephrosis. Bladder: The bladder is decompressed by a Foley catheter. IMPRESSION: 1. Right renal cyst. 2. No hydronephrosis. 3. Foley catheter Electronically Signed   By: Norva Pavlov M.D.   On: 07/12/2016 18:29   US Abdomen Limited Ruq  Result Date: 07/11/2016 CLINICAL DATA:  Elevated LFTs, lipase and white count EXAM: ULTRASOUND ABDOMEN LIMITED RIGHT UPPER QUADRANT COMPARISON:  07/01/2014 FINDINGS: Gallbladder: Multiple shadowing stones within the gallbladder measuring up to 9 mm. Gallbladder wall thickening up to 5.7 mm. Positive sonographic Murphy. Common bile duct: Diameter: 3.7 mm Liver: Increased echogenicity consistent with fatty infiltration. Incidentally noted is cortical thinning of the right kidney with cysts measuring up to 1.5 cm. IMPRESSION: 1. Multiple gallstones with wall thickening and positive sonographic Murphy sign, findings are suspicious for an acute cholecystitis. No biliary dilatation 2. Echogenic liver consistent with fatty infiltration 3. Right renal cysts Electronically Signed   By: Jasmine Pang  M.D.   On: 07/11/2016 23:53    Anti-infectives: Anti-infectives    Start     Dose/Rate Route Frequency Ordered Stop   07/12/16 1700  vancomycin (VANCOCIN) IVPB 1000 mg/200 mL premix  Status:  Discontinued     1,000 mg 200 mL/hr over 60 Minutes Intravenous Every 36 hours  07/12/16 0332 07/12/16 1020   07/12/16 1030  meropenem (MERREM) 1 g in sodium chloride 0.9 % 100 mL IVPB     1 g 200 mL/hr over 30 Minutes Intravenous Every 12 hours 07/12/16 1021     07/12/16 1000  piperacillin-tazobactam (ZOSYN) IVPB 3.375 g  Status:  Discontinued     3.375 g 12.5 mL/hr over 240 Minutes Intravenous Every 12 hours 07/12/16 0210 07/12/16 1020   07/12/16 0230  metroNIDAZOLE (FLAGYL) IVPB 500 mg  Status:  Discontinued     500 mg 100 mL/hr over 60 Minutes Intravenous Every 8 hours 07/12/16 0218 07/12/16 1020   07/12/16 0215  vancomycin (VANCOCIN) IVPB 1000 mg/200 mL premix     1,000 mg 200 mL/hr over 60 Minutes Intravenous  Once 07/12/16 0210 07/12/16 0350   07/11/16 2215  piperacillin-tazobactam (ZOSYN) IVPB 3.375 g     3.375 g 100 mL/hr over 30 Minutes Intravenous  Once 07/11/16 2212 07/11/16 2330      Assessment/Plan:  83 year old female with multiple medical problems and likely cholecystitis. Clinically patient is doing much better and her labs are returning to normal. Recommend continuing antibiotic therapy. She continues to be a poor operative candidate. Would be okay to start patient on a clear liquid diet today and a surgical standpoint. No plans for operative intervention at this time, however surgery will continue to follow with you until she completely resolves.  Ronnell Clinger T. Tonita CongWoodham, MD, [facility] General Surgeon [facility]  Day [facility] [phone](7a-7p) [phone] Night ASCOM [phone](7p-7a) [phone] 07/13/2016

## 2016-07-13 NOTE — Progress Notes (Addendum)
ANTICOAGULATION CONSULT NOTE - Initial Consult  Pharmacy Consult for Heparin  Indication: atrial fibrillation  No Known Allergies  Patient Measurements: Height: 5\' 3"  (160 cm) Weight: 242 lb 14.4 oz (110.2 kg) IBW/kg (Calculated) : 52.4 Heparin Dosing Weight: 79kg   Vital Signs: Temp: 97.8 F (36.6 C) (06/17 0428) Temp Source: Oral (06/17 0428) BP: 105/66 (06/17 0428) Pulse Rate: 109 (06/17 0428)  Labs:  Recent Labs  07/11/16 1905 07/12/16 0449 07/13/16 0537  HGB 13.9 13.1 12.7  HCT 41.3 39.0 37.6  PLT 182 151 124*  LABPROT  --  21.8*  --   INR  --  1.87  --   CREATININE 3.01* 3.03* 3.16*    Estimated Creatinine Clearance: 15.8 mL/min (A) (by C-G formula based on SCr of 3.16 mg/dL (H)).   Medical History: Past Medical History:  Diagnosis Date  . Atrial fibrillation, unspecified   . Dementia   . Essential hypertension   . Hypercholesteremia   . Insomnia   . Major depression, recurrent, chronic (HCC)   . Mood disorder (HCC)   . Parkinson disease (HCC)   . Renal disorder   . Renal insufficiency   . Schizoaffective disorder, bipolar type (HCC)   . Stroke [facility](HCC)     Assessment: 83 yo female with PMH of A.Fib, admitted with Sepsis. Pharmacy consulted for heparin dosing and monitoring. Patient takes Rivaroxaban 15mg  outpatient, but oral anticoagulation is being held incase patient needs surgery. Last dose of Rivaroxaban  was  6/16 at 1015.   Goal of Therapy:  Heparin level 0.3-0.7 units/ml aPTT 66-102 seconds Monitor platelets by anticoagulation protocol: Yes   Plan:  Baseline aPTT, PT/INR, and HL ordered.  Will not bolus since patient was taking Rivaroxaban. Start heparin infusion at 1150 units/hr Will need to adjust heparin based on aPTT levels until aPTT and anti-Xa levels correlate.  Check aPTT and Anti-Xa daily while on heparin Continue to monitor H&H and platelets   Gardner CandleSheema M Basya Casavant, PharmD, BCPS Clinical Pharmacist 07/13/2016 10:52 AM

## 2016-07-14 DIAGNOSIS — A419 Sepsis, unspecified organism: Secondary | ICD-10-CM

## 2016-07-14 LAB — URINE CULTURE

## 2016-07-14 LAB — CULTURE, BLOOD (ROUTINE X 2)
SPECIAL REQUESTS: ADEQUATE
Special Requests: ADEQUATE

## 2016-07-14 LAB — COMPREHENSIVE METABOLIC PANEL
ALT: 96 U/L — ABNORMAL HIGH (ref 14–54)
ANION GAP: 6 (ref 5–15)
AST: 50 U/L — AB (ref 15–41)
Albumin: 2.3 g/dL — ABNORMAL LOW (ref 3.5–5.0)
Alkaline Phosphatase: 69 U/L (ref 38–126)
BUN: 70 mg/dL — AB (ref 6–20)
CHLORIDE: 109 mmol/L (ref 101–111)
CO2: 24 mmol/L (ref 22–32)
Calcium: 8 mg/dL — ABNORMAL LOW (ref 8.9–10.3)
Creatinine, Ser: 2.62 mg/dL — ABNORMAL HIGH (ref 0.44–1.00)
GFR calc Af Amer: 18 mL/min — ABNORMAL LOW (ref >60)
GFR, EST NON AFRICAN AMERICAN: 16 mL/min — AB (ref >60)
Glucose, Bld: 141 mg/dL — ABNORMAL HIGH (ref 65–99)
POTASSIUM: 3.8 mmol/L (ref 3.5–5.1)
Sodium: 139 mmol/L (ref 135–145)
TOTAL PROTEIN: 5.8 g/dL — AB (ref 6.5–8.1)
Total Bilirubin: 0.8 mg/dL (ref 0.3–1.2)

## 2016-07-14 LAB — CBC
HEMATOCRIT: 36.7 % (ref 35.0–47.0)
HEMOGLOBIN: 12.2 g/dL (ref 12.0–16.0)
MCH: 31.2 pg (ref 26.0–34.0)
MCHC: 33.2 g/dL (ref 32.0–36.0)
MCV: 94 fL (ref 80.0–100.0)
Platelets: 131 10*3/uL — ABNORMAL LOW (ref 150–440)
RBC: 3.9 MIL/uL (ref 3.80–5.20)
RDW: 13.2 % (ref 11.5–14.5)
WBC: 13.8 10*3/uL — AB (ref 3.6–11.0)

## 2016-07-14 LAB — APTT: aPTT: 68 seconds — ABNORMAL HIGH (ref 24–36)

## 2016-07-14 LAB — HEPARIN LEVEL (UNFRACTIONATED): HEPARIN UNFRACTIONATED: 1.06 [IU]/mL — AB (ref 0.30–0.70)

## 2016-07-14 LAB — MAGNESIUM: MAGNESIUM: 2.1 mg/dL (ref 1.7–2.4)

## 2016-07-14 LAB — PROCALCITONIN: PROCALCITONIN: 35.08 ng/mL

## 2016-07-14 MED ORDER — ACETAMINOPHEN 325 MG PO TABS: 650.0000 mg | ORAL_TABLET | Freq: Four times a day (QID) | ORAL | Status: DC | PRN

## 2016-07-14 MED ORDER — RIVAROXABAN 15 MG PO TABS
15.0000 mg | ORAL_TABLET | Freq: Every day | ORAL | Status: DC
  Administered 2016-07-14 – 2016-07-15 (×2): 15 mg via ORAL
  Filled 2016-07-14 (×2): qty 1

## 2016-07-14 MED ORDER — SODIUM CHLORIDE 0.9 % IV SOLN
3.0000 g | Freq: Two times a day (BID) | INTRAVENOUS | Status: DC
  Administered 2016-07-14: 3 g via INTRAVENOUS
  Filled 2016-07-14 (×3): qty 3

## 2016-07-14 MED ORDER — ENSURE ENLIVE PO LIQD: 237.0000 mL | Freq: Three times a day (TID) | ORAL | Status: DC | PRN

## 2016-07-14 MED ORDER — MEROPENEM-SODIUM CHLORIDE 500 MG/50ML IV SOLR
500.0000 mg | Freq: Two times a day (BID) | INTRAVENOUS | Status: DC
  Administered 2016-07-14 – 2016-07-15 (×2): 500 mg via INTRAVENOUS
  Filled 2016-07-14 (×5): qty 50

## 2016-07-14 NOTE — [place] FL2 (Signed)
[place] MEDICAID FL2 LEVEL OF CARE SCREENING TOOL     IDENTIFICATION  Patient Name: Kirsten Tran Birthdate: 05/03/1930 Sex: female Admission Date (Current Location): 07/11/2016  [place]   Facility and Address:  [facility], [address], [place], [place] [address]      Provider Number: [id number]  Attending Physician Name and Address:  Adrian SaranMody, Sital, MD  Relative Name and Phone Number:       Current Level of Care: Hospital Recommended Level of Care: Skilled Nursing Facility Prior Approval Number:    Date Approved/Denied:   PASRR Number:    Discharge Plan: SNF    Current Diagnoses: Patient Active Problem List   Diagnosis Date Noted  . AKI (acute kidney injury) (HCC) 07/12/2016  . Pancreatitis 07/11/2016  . Cholecystitis 07/11/2016  . HTN (hypertension) 07/11/2016  . HLD (hyperlipidemia) 07/11/2016  . Hypothyroidism 07/11/2016  . Sepsis (HCC) 06/30/2014  . UTI (lower urinary tract infection) 06/30/2014  . Parkinson disease (HCC)   . Atrial fibrillation, unspecified   . Dementia     Orientation RESPIRATION BLADDER Height & Weight     Self  Normal, O2 (3 liters) Continent Weight: 242 lb 14.4 oz (110.2 kg) Height:  5\' 3"  (160 cm)  BEHAVIORAL SYMPTOMS/MOOD NEUROLOGICAL BOWEL NUTRITION STATUS   (none)  (none) Continent Diet (soft)  AMBULATORY STATUS COMMUNICATION OF NEEDS Skin   Extensive Assist Verbally Normal                       Personal Care Assistance Level of Assistance  Dressing, Bathing Bathing Assistance: Maximum assistance   Dressing Assistance: Maximum assistance     Functional Limitations Info   (no issues)          SPECIAL CARE FACTORS FREQUENCY                       Contractures Contractures Info: Not present    Additional Factors Info  Code Status Code Status Info: dnr             Current Medications (07/14/2016):  This is the current hospital  active medication list Current Facility-Administered Medications  Medication Dose Route Frequency Provider Last Rate Last Dose  . Ampicillin-Sulbactam (UNASYN) 3 g in sodium chloride 0.9 % 100 mL IVPB  3 g Intravenous Q12H Mody, Sital, MD      . ARIPiprazole (ABILIFY) tablet 5 mg  5 mg Oral Daily Oralia ManisWillis, David, MD   5 mg at 07/14/16 1015  . brimonidine (ALPHAGAN) 0.2 % ophthalmic solution 1 drop  1 drop Both Eyes Daily Oralia ManisWillis, David, MD   1 drop at 07/14/16 1004   And  . timolol (TIMOPTIC) 0.5 % ophthalmic solution 1 drop  1 drop Both Eyes Daily Oralia ManisWillis, David, MD   1 drop at 07/14/16 1005  . carvedilol (COREG) tablet 3.125 mg  3.125 mg Oral BID WC Mody, Sital, MD   3.125 mg at 07/14/16 1003  . HYDROmorphone (DILAUDID) injection 0.5 mg  0.5 mg Intravenous Q4H PRN Marylou Flesherukov, Magadalene S, NP   0.5 mg at 07/12/16 0620  . latanoprost (XALATAN) 0.005 % ophthalmic solution 1 drop  1 drop Both Eyes QHS Oralia ManisWillis, David, MD   1 drop at 07/13/16 2312  . levothyroxine (SYNTHROID, LEVOTHROID) tablet 25 mcg  25 mcg Oral QAC breakfast Oralia ManisWillis, David, MD   25 mcg at 07/14/16 1002  . magnesium sulfate IVPB 1 g 100 mL  1 g Intravenous Once Marylou Flesher S, NP      . MEDLINE mouth rinse  15 mL Mouth Rinse BID Oralia Manis, MD   15 mL at 07/14/16 1008  . mometasone-formoterol (DULERA) 100-5 MCG/ACT inhaler 2 puff  2 puff Inhalation BID Oralia Manis, MD   2 puff at 07/14/16 1005  . morphine 2 MG/ML injection 2 mg  2 mg Intravenous Q4H PRN Oralia Manis, MD      . ondansetron [facility]) tablet 4 mg  4 mg Oral Q6H PRN Oralia Manis, MD       Or  . ondansetron [facility]) injection 4 mg  4 mg Intravenous Q6H PRN Oralia Manis, MD   4 mg at 07/13/16 2313  . oxyCODONE (Oxy IR/ROXICODONE) immediate release tablet 5 mg  5 mg Oral Q4H PRN Oralia Manis, MD      . Rivaroxaban [name]) tablet 15 mg  15 mg Oral Daily Adrian Saran, MD   15 mg at 07/14/16 1015     Discharge Medications: Please see discharge summary for a list of  discharge medications.  Relevant Imaging Results:  Relevant Lab Results:   Additional Information    York Spaniel, LCSW

## 2016-07-14 NOTE — Progress Notes (Signed)
Pharmacy Antibiotic Note  Kirsten ForwardMarjorie J Tran is a 83 y.o. female admitted on 07/11/2016 with ESBL E.coli  bacteremia/UTI.  Pharmacy has been consulted for Meropenem dosing.  Plan: Will order Meropenem 500mg  IV q12h for Crcl 19 ml/min. (normal dosing of 1 gram IV q8h).   Height: 5\' 3"  (160 cm) Weight: 242 lb 14.4 oz (110.2 kg) IBW/kg (Calculated) : 52.4  Temp (24hrs), Avg:98.2 F (36.8 C), Min:98.1 F (36.7 C), Max:98.3 F (36.8 C)   Recent Labs Lab 07/11/16 1905 07/11/16 2358 07/12/16 0449 07/12/16 2131 07/13/16 0537 07/14/16 0511  WBC 24.5*  --  19.8*  --  14.9* 13.8*  CREATININE 3.01*  --  3.03*  --  3.16* 2.62*  LATICACIDVEN 2.2* 2.3*  --  1.2  --   --     Estimated Creatinine Clearance: 19.1 mL/min (A) (by C-G formula based on SCr of 2.62 mg/dL (H)).    No Known Allergies  Antimicrobials this admission: AI - sepsis cholecystitis; UTI 6/15 Zosyn >> 6/16 6/16 Metronidazole/Vancomcyin >> 6/16 6/16 meropenem >> 6/18 Unasyn 6/18 >> 6/18 Meropenem restarted 6/18 >>    Dose adjustments this admission:    Microbiology results: 6/15 BCx: E.coli- ESBL 6/16 UCx: E.coli- ESBL    Sputum:      MRSA PCR:  Negative Cdiff negative  Thank you for allowing pharmacy to be a part of this patient's care.  Kirsten Tran A 07/14/2016 5:03 PM

## 2016-07-14 NOTE — Clinical Social Work Note (Signed)
Clinical Social Work Assessment  Patient Details  Name: Kirsten Tran MRN: [id number] Date of Birth: [birth year]  Date of referral:  07/14/16               Reason for consult:  Facility Placement                Permission sought to share information with:    Permission granted to share information::     Name::        Agency::     Relationship::     Contact Information:     Housing/Transportation Living arrangements for the past 2 months:  [facility] Facility Source of Information:  Adult Children Patient Interpreter Needed:  None Criminal Activity/Legal Involvement Pertinent to Current Situation/Hospitalization:  No - Comment as needed Significant Relationships:  Adult Children Lives with:  Facility Resident Do you feel safe going back to the place where you live?  Yes Need for family participation in patient care:  Yes (Comment)  Care giving concerns:  Patient resides as a long term care resident at [facility].   Social Worker assessment / plan:  CSW informed in progression rounds that patient is a resident at [facility]. CSW aware that patient is confused and CSW contacted patient's daughter: Kirsten BatchKaren Tran: [phone]. CSW explained role and purpose of phone call. Patient's daughter states that she does want patient to return to [facility] when it is time for her discharge from the hospital. Patient's daughter states that patient has her own CPAP machine that she uses there. She has no concerns regarding patient returning to [facility].   Employment status:  Retired Database administratornsurance information:  Managed Medicare PT Recommendations:    Information / Referral to community resources:     Patient/Family's Response to care:  Patient's daughter expressed appreciation for CSW assistance.  Patient/Family's Understanding of and Emotional Response to Diagnosis, Current Treatment, and Prognosis:  Patient is confused and patient's daughter is  involved in patient's care and was at the hospital most of the day.   Emotional Assessment Appearance:  Appears stated age Attitude/Demeanor/Rapport:   (confused, quiet, calm) Affect (typically observed):    Orientation:  Oriented to Self Alcohol / Substance use:  Not Applicable Psych involvement (Current and /or in the community):  No (Comment)  Discharge Needs  Concerns to be addressed:  Care Coordination Readmission within the last 30 days:  No Current discharge risk:  None Barriers to Discharge:  No Barriers Identified   York SpanielMonica Dynesha Woolen, LCSW 07/14/2016, 2:00 PM

## 2016-07-14 NOTE — Progress Notes (Signed)
[employer] [facility] - [place] at [facility]   PATIENT NAME: Kirsten Tran    MR#:  [id number]  DATE OF BIRTH:  1930/04/24  SUBJECTIVE:    Overnight. REVIEW OF SYSTEMS:   Review of Systems  Constitutional: Positive for malaise/fatigue. Negative for chills and fever.  HENT: Negative.  Negative for ear discharge, ear pain, hearing loss, nosebleeds and sore throat.   Eyes: Negative.  Negative for blurred vision and pain.  Respiratory: Negative.  Negative for cough, hemoptysis, shortness of breath and wheezing.   Cardiovascular: Negative.  Negative for chest pain, palpitations and leg swelling.  Gastrointestinal: Negative.  Negative for abdominal pain, blood in stool, diarrhea, nausea and vomiting.  Genitourinary: Negative.  Negative for dysuria.  Musculoskeletal: Negative.  Negative for back pain.  Skin: Negative.   Neurological: Positive for weakness. Negative for dizziness, tremors, speech change, focal weakness, seizures and headaches.  Endo/Heme/Allergies: Negative.  Does not bruise/bleed easily.  Psychiatric/Behavioral: Positive for memory loss. Negative for depression, hallucinations and suicidal ideas.    Tolerating Diet: yes      DRUG ALLERGIES:  No Known Allergies  VITALS:  Blood pressure 122/62, pulse 98, temperature 98.1 F (36.7 C), temperature source Oral, resp. rate (!) 22, height 5\' 3"  (1.6 m), weight 110.2 kg (242 lb 14.4 oz), SpO2 97 %.  PHYSICAL EXAMINATION:  Constitutional: Appears well-developed and ill appearing HENT: Normocephalic. Marland Kitchen Oropharynx is clear and moist.  Eyes: Conjunctivae and EOM are normal. PERRLA, no scleral icterus.  Neck: Normal ROM. Neck supple. No JVD. No tracheal deviation. CVS: RRR, S1/S2 +, no murmurs, no gallops, no carotid bruit.  Pulmonary: Effort and breath sounds normal, no stridor, rhonchi, wheezes, rales.  Abdominal: Soft. BS +,  no distension, tenderness, rebound or guarding.  Musculoskeletal: Normal range of  motion. No edema and no tenderness.  Neuro: Alert. CN 2-12 grossly intact. No focal deficits. Skin: Skin is warm and dry. No rash noted. Psychiatric: Normal mood and affect.      LABORATORY PANEL:   CBC  Recent Labs Lab 07/14/16 0511  WBC 13.8*  HGB 12.2  HCT 36.7  PLT 131*   ------------------------------------------------------------------------------------------------------------------  Chemistries   Recent Labs Lab 07/12/16 0449  07/14/16 0511  NA 141  < > 139  K 4.4  < > 3.8  CL 108  < > 109  CO2 24  < > 24  GLUCOSE 175*  < > 141*  BUN 65*  < > 70*  CREATININE 3.03*  < > 2.62*  CALCIUM 8.5*  < > 8.0*  MG 1.6*  --   --   AST 274*  < > 50*  ALT 223*  < > 96*  ALKPHOS 61  < > 69  BILITOT 2.1*  < > 0.8  < > = values in this interval not displayed. ------------------------------------------------------------------------------------------------------------------  Cardiac Enzymes No results for input(s): TROPONINI in the last 168 hours. ------------------------------------------------------------------------------------------------------------------  RADIOLOGY:  Dg Chest 1 View  Result Date: 07/13/2016 CLINICAL DATA:  Dyspnea. EXAM: CHEST 1 VIEW COMPARISON:  07/11/2016 FINDINGS: Shallow lung inflation. Heart enlarged. Prominent pulmonary artery segment is stable. Patient is rotated. There are no focal consolidations or pleural effusions. No evidence for pulmonary edema. Perihilar bronchitic changes are stable. IMPRESSION: 1. Shallow inflation. 2. Cardiomegaly without pulmonary edema. Electronically Signed   By: Norva Pavlov M.D.   On: 07/13/2016 08:40   US Renal  Result Date: 07/12/2016 CLINICAL DATA:  Acute renal failure. EXAM: RENAL / URINARY TRACT ULTRASOUND COMPLETE COMPARISON:  CT  of the abdomen and pelvis on 07/01/2014 FINDINGS: Right Kidney: Length: 11.8 cm. Cyst in the midpole region is 3.3 x 3.1 x 3.8 cm. Renal parenchyma is normal echogenicity. No  hydronephrosis or solid renal mass appear Left Kidney: Length: 13.0 cm.   No focal renal mass or hydronephrosis. Bladder: The bladder is decompressed by a Foley catheter. IMPRESSION: 1. Right renal cyst. 2. No hydronephrosis. 3. Foley catheter Electronically Signed   By: Norva PavlovElizabeth  Brown M.D.   On: 07/12/2016 18:29     ASSESSMENT AND PLAN:   83 year old female with Parkinson's disease and dementia who presents with generalized weakness and found of sepsis from acute cholecystitis  1. ESBL Escherichia coli Sepsis from gallbladder source: She presents with elevated lactic acid, leukocytosis, tachycardia  Blood cultures are positive for Escherichia coli Consult ID for duration and antibiotic choice The patient needs ID consult for ertapenem We'll use Unasyn for now  2. Acute cholecystitis: Surgery evaluation appreciated.  Patient is poor surgical candidate  Symptoms have improved.   3. Atrial fibrillation : Heart rate better controlled with Coreg Restart oral anticoagulation.  4. Acute kidney injury in the setting of sepsis Creatinine has improved Renal ultrasound without hydronephrosis  5. Elevated LFTs due to acute cholecystitis This has improved. 6. Leukocytosis: Slowly resolving this is due to sepsis.  7. Chronic respiratory failure on 2 L of oxygen   Management plans discussed with nursing.  CODE STATUS: dnr  TOTAL TIME TAKING CARE OF THIS PATIENT: 25 minutes.   Physical therapy consultation for disposition  POSSIBLE D/C tomorrow, DEPENDING ON CLINICAL CONDITION.   Mieko Kneebone M.D on 07/14/2016 at 11:58 AM  Between 7am to 6pm - Pager - [phone] After 6pm go to [email or web address] - password [email or web address]  [facility]  Office  [phone]  CC: Primary care physician; Patient, No Pcp Per  Note: This dictation was prepared with Dragon dictation along with smaller phrase technology. Any transcriptional errors that result from this process are  unintentional.

## 2016-07-14 NOTE — Progress Notes (Signed)
Initial Nutrition Assessment  DOCUMENTATION CODES:   Morbid obesity  INTERVENTION:  Provide Ensure Enlive po PRN if patient has inadequate intake of meals. Each supplement provides 350 kcal and 20 grams of protein.  NUTRITION DIAGNOSIS:   Increased nutrient needs related to catabolic illness (sepsis, acute cholecystitis) as evidenced by estimated needs.  GOAL:   Patient will meet greater than or equal to 90% of their needs  MONITOR:   PO intake, Supplement acceptance, Labs, Weight trends, I & O's  REASON FOR ASSESSMENT:   Malnutrition Screening Tool    ASSESSMENT:   83 year old female with PMHx of dementia, Parkinson's disease, HTN, A-fib, hx of CVA, depression, bipolar disorder, who presented with weakness and was round to have sepsis from acute cholecystitis.   -Per chart patient is not a surgical candidate.   Spoke with patient at bedside. Noted history of dementia and suspect patient is a poor historian, but no family members present at time of assessment. Patient is reporting she usually has a good appetite and eats well. Reports she eats 3 meals per day. Reports she does not drink any oral nutrition supplements. Denying any abdominal pain, nausea, difficulty chewing/swallowing, constipation/diarrhea.   Patient reports she is unsure if she is losing weight. Current body weight of 242.9 lbs. Per chart patient was 262.2 lbs on 07/05/2014. That would be an insignificant weight loss over the past two years. Prior to that last weight was from [facility] of 250.2 lbs on 05/14/2011.  Meal Completion: 100% of lunch today per chart - but since family fed patient they may have finished what patient did not finish  Medications reviewed and include: levothyroxine, Unasyn.   Labs reviewed: BUN 70, Creatinine 2.62.   Nutrition-Focused physical exam completed. Findings are no fat depletion, mild-moderate muscle depletion, and no edema. Difficult to truly assess muscle status in  setting of obesity.   At this time patient does not meet the criteria for malnutrition.  Discussed with RN. Patient's family fed her today so unsure how much of the meal patient ate. No reports of difficulty chewing or swallowing.   Diet Order:  DIET SOFT Room service appropriate? Yes; Fluid consistency: Thin  Skin:  Wound (see comment) (MSAD to groin)  Last BM:  07/13/2016   Height:   Ht Readings from Last 1 Encounters:  07/12/16 5\' 3"  (1.6 m)    Weight:   Wt Readings from Last 1 Encounters:  07/11/16 242 lb 14.4 oz (110.2 kg)    Ideal Body Weight:  52.3 kg  BMI:  Body mass index is 43.03 kg/m.  Estimated Nutritional Needs:   Kcal:  1830-2140 (MSJ x 1.2-1.4)  Protein:  100-120 grams (0.9-1.1 grams/kg)  Fluid:  1.8-2.1 L/day  EDUCATION NEEDS:   No education needs identified at this time  Helane RimaLeanne Joziah Dollins, MS, RD, LDN Pager: [phone] After Hours Pager: [phone]

## 2016-07-14 NOTE — Progress Notes (Signed)
ANTICOAGULATION CONSULT NOTE - Initial Consult  Pharmacy Consult for Rivaroxaban Indication: atrial fibrillation  No Known Allergies  Patient Measurements: Height: 5\' 3"  (160 cm) Weight: 242 lb 14.4 oz (110.2 kg) IBW/kg (Calculated) : 52.4   Vital Signs: Temp: 98.1 F (36.7 C) (06/18 0430) Temp Source: Oral (06/18 0430) BP: 125/58 (06/18 0430) Pulse Rate: 105 (06/18 0430)  Labs:  Recent Labs  07/12/16 0449 07/13/16 0537 07/13/16 1141 07/13/16 2126 07/14/16 0511  HGB 13.1 12.7  --   --  12.2  HCT 39.0 37.6  --   --  36.7  PLT 151 124*  --   --  131*  APTT  --   --  37* 56* 68*  LABPROT 21.8*  --   --   --   --   INR 1.87  --   --   --   --   HEPARINUNFRC  --   --  3.50*  --  1.06*  CREATININE 3.03* 3.16*  --   --  2.62*    Estimated Creatinine Clearance: 19.1 mL/min (A) (by C-G formula based on SCr of 2.62 mg/dL (H)).   Medical History: Past Medical History:  Diagnosis Date  . Atrial fibrillation, unspecified   . Dementia   . Essential hypertension   . Hypercholesteremia   . Insomnia   . Major depression, recurrent, chronic (HCC)   . Mood disorder (HCC)   . Parkinson disease (HCC)   . Renal disorder   . Renal insufficiency   . Schizoaffective disorder, bipolar type (HCC)   . Stroke [facility](HCC)      Assessment: 83 yo female on rivaroxaban PTA for AFib who was transitioned to heparin, transitioning back to rivaroxaban. RN confirms heparin drip was stopped.  TBW CrCl ~ 27.8 ml/min.  Goal of Therapy:  Monitor platelets by anticoagulation protocol: Yes   Plan:  Will order rivaroxaban 15 mg PO daily to start now SCr and CBC q3days  Pharmacy will continue to follow.   Crist FatWang, Mechelle Pates L 07/14/2016,10:01 AM

## 2016-07-14 NOTE — Progress Notes (Signed)
CC: Cholecystitis Subjective: This patient admitted with a diagnosis of cholecystitis. She is demented but denies any abdominal pain at this time no family members or caretakers are present.  Review of systems is not possible due to patient confusion.  Objective: Vital signs in last 24 hours: Temp:  [98.1 F (36.7 C)-98.3 F (36.8 C)] 98.1 F (36.7 C) (06/18 0430) Pulse Rate:  [85-115] 98 (06/18 1002) Resp:  [17-22] 22 (06/18 0430) BP: (116-136)/(56-90) 122/62 (06/18 1002) SpO2:  [97 %-99 %] 97 % (06/18 0430) Last BM Date: 07/13/16  Intake/Output from previous day: 06/17 0701 - 06/18 0700 In: 519 [P.O.:360; I.V.:59; IV Piggyback:100] Out: 1950 [Urine:1950] Intake/Output this shift: No intake/output data recorded.  Physical exam:  Appendectomy patient vital signs are stable soft nontender abdomen negative Murphy sign no icterus no jaundice nontender calves morbidly obese.  Lab Results: CBC   Recent Labs  07/13/16 0537 07/14/16 0511  WBC 14.9* 13.8*  HGB 12.7 12.2  HCT 37.6 36.7  PLT 124* 131*   BMET  Recent Labs  07/13/16 0537 07/14/16 0511  NA 140 139  K 4.2 3.8  CL 110 109  CO2 23 24  GLUCOSE 122* 141*  BUN 70* 70*  CREATININE 3.16* 2.62*  CALCIUM 8.1* 8.0*   PT/INR  Recent Labs  07/12/16 0449  LABPROT 21.8*  INR 1.87   ABG No results for input(s): PHART, HCO3 in the last 72 hours.  Invalid input(s): PCO2, PO2  Studies/Results: Dg Chest 1 View  Result Date: 07/13/2016 CLINICAL DATA:  Dyspnea. EXAM: CHEST 1 VIEW COMPARISON:  07/11/2016 FINDINGS: Shallow lung inflation. Heart enlarged. Prominent pulmonary artery segment is stable. Patient is rotated. There are no focal consolidations or pleural effusions. No evidence for pulmonary edema. Perihilar bronchitic changes are stable. IMPRESSION: 1. Shallow inflation. 2. Cardiomegaly without pulmonary edema. Electronically Signed   By: Norva PavlovElizabeth  Brown M.D.   On: 07/13/2016 08:40   [place] Renal  Result  Date: 07/12/2016 CLINICAL DATA:  Acute renal failure. EXAM: RENAL / URINARY TRACT ULTRASOUND COMPLETE COMPARISON:  CT of the abdomen and pelvis on 07/01/2014 FINDINGS: Right Kidney: Length: 11.8 cm. Cyst in the midpole region is 3.3 x 3.1 x 3.8 cm. Renal parenchyma is normal echogenicity. No hydronephrosis or solid renal mass appear Left Kidney: Length: 13.0 cm.   No focal renal mass or hydronephrosis. Bladder: The bladder is decompressed by a Foley catheter. IMPRESSION: 1. Right renal cyst. 2. No hydronephrosis. 3. Foley catheter Electronically Signed   By: Norva PavlovElizabeth  Brown M.D.   On: 07/12/2016 18:29    Anti-infectives: Anti-infectives    Start     Dose/Rate Route Frequency Ordered Stop   07/12/16 1700  vancomycin (VANCOCIN) IVPB 1000 mg/200 mL premix  Status:  Discontinued     1,000 mg 200 mL/hr over 60 Minutes Intravenous Every 36 hours 07/12/16 0332 07/12/16 1020   07/12/16 1030  meropenem (MERREM) 1 g in sodium chloride 0.9 % 100 mL IVPB  Status:  Discontinued     1 g 200 mL/hr over 30 Minutes Intravenous Every 12 hours 07/12/16 1021 07/14/16 0902   07/12/16 1000  piperacillin-tazobactam (ZOSYN) IVPB 3.375 g  Status:  Discontinued     3.375 g 12.5 mL/hr over 240 Minutes Intravenous Every 12 hours 07/12/16 0210 07/12/16 1020   07/12/16 0230  metroNIDAZOLE (FLAGYL) IVPB 500 mg  Status:  Discontinued     500 mg 100 mL/hr over 60 Minutes Intravenous Every 8 hours 07/12/16 0218 07/12/16 1020   07/12/16 0215  vancomycin (VANCOCIN) IVPB 1000 mg/200 mL premix     1,000 mg 200 mL/hr over 60 Minutes Intravenous  Once 07/12/16 0210 07/12/16 0350   07/11/16 2215  piperacillin-tazobactam (ZOSYN) IVPB 3.375 g     3.375 g 100 mL/hr over 30 Minutes Intravenous  Once 07/11/16 2212 07/11/16 2330      Assessment/Plan:  White blood cell count remains elevated as well as creatinine. Patient's clinical condition appears improved and I see no need for surgical intervention at this time. Available as  needed. Lattie Haw, MD, FACS  07/14/2016

## 2016-07-14 NOTE — Consult Note (Addendum)
[facility] Infectious Disease     Reason for Consult ESBL sepsis    Referring Physician: Bettey Costa  Date of Admission:  07/11/2016   Principal Problem:   Sepsis ([place]) Active Problems:   Dementia   Pancreatitis   Cholecystitis   HTN (hypertension)   HLD (hyperlipidemia)   Hypothyroidism   AKI (acute kidney injury) ([place])   HPI: Kirsten Tran is a 83 y.o. female admitted with weakness and malaise and found to have acute gallstone pancreatitis,  wbc of 24 K and UA with TNTC WBC.  Also with AKI.  Blood cx with ESBL E coli. UCX Same.  USS shows cholecystitis.  She had recent admission 6/3-6/8 for cholecystitis, UTI and E coli pansensitive bacteremia  and was dced on levofloxain.   Past Medical History:  Diagnosis Date  . Atrial fibrillation, unspecified   . Dementia   . Essential hypertension   . Hypercholesteremia   . Insomnia   . Major depression, recurrent, chronic ([place])   . Mood disorder ([place])   . Parkinson disease ([place])   . Renal disorder   . Renal insufficiency   . Schizoaffective disorder, bipolar type ([place])   . Stroke [facility])    History reviewed. No pertinent surgical history. Social History  Substance Use Topics  . Smoking status: Never Smoker  . Smokeless tobacco: Never Used  . Alcohol use No   Family History  Problem Relation Age of Onset  . Family history unknown: Yes    Allergies: No Known Allergies  Current antibiotics: Antibiotics Given (last 72 hours)    Date/Time Action Medication Dose Rate   07/11/16 2228 New Bag/Given   piperacillin-tazobactam (ZOSYN) IVPB 3.375 g 3.375 g 100 mL/hr   07/12/16 0240 New Bag/Given   vancomycin (VANCOCIN) IVPB 1000 mg/200 mL premix 1,000 mg 200 mL/hr   07/12/16 0350 New Bag/Given   metroNIDAZOLE (FLAGYL) IVPB 500 mg 500 mg 100 mL/hr   07/12/16 2132 New Bag/Given   meropenem (MERREM) 1 g in sodium chloride 0.9 % 100 mL IVPB 1 g 200 mL/hr   07/13/16 1212 New Bag/Given   meropenem (MERREM) 1 g in sodium  chloride 0.9 % 100 mL IVPB 1 g 200 mL/hr   07/13/16 2303 New Bag/Given   meropenem (MERREM) 1 g in sodium chloride 0.9 % 100 mL IVPB 1 g 200 mL/hr      MEDICATIONS: . ARIPiprazole  5 mg Oral Daily  . brimonidine  1 drop Both Eyes Daily   And  . timolol  1 drop Both Eyes Daily  . carvedilol  3.125 mg Oral BID WC  . latanoprost  1 drop Both Eyes QHS  . levothyroxine  25 mcg Oral QAC breakfast  . mouth rinse  15 mL Mouth Rinse BID  . mometasone-formoterol  2 puff Inhalation BID  . rivaroxaban  15 mg Oral Daily    Review of Systems - unable to obtain  OBJECTIVE: Temp:  [98.1 F (36.7 C)-98.3 F (36.8 C)] 98.2 F (36.8 C) (06/18 1214) Pulse Rate:  [87-115] 87 (06/18 1214) Resp:  [18-22] 18 (06/18 1214) BP: (98-136)/(58-90) 98/58 (06/18 1214) SpO2:  [97 %-98 %] 98 % (06/18 1214) Physical Exam  Constitutional:  Obese, nad, sitting in chair HENT: [place]/AT, PERRLA, no scleral icterus Mouth/Throat: Oropharynx is clear and moist. No oropharyngeal exudate.  Cardiovascular: Normal rate, regular rhythm and normal heart sounds. Exam reveals no gallop and no friction rub.  No murmur heard.  Pulmonary/Chest: Effort normal and breath sounds normal. No  respiratory distress.  has no wheezes.  Neck = supple, no nuchal rigidity Abdominal: Soft. Bowel sounds are normal.  exhibits no distension. There is no tenderness.  Lymphadenopathy: no cervical adenopathy. No axillary adenopathy Neurological: pleasantly demented Skin: Skin is warm and dry. No rash noted. No erythema.  Psychiatric: a normal mood and affect.  behavior is normal.    LABS: Results for orders placed or performed during the hospital encounter of 07/11/16 (from the past 48 hour(s))  Lactic acid, plasma     Status: None   Collection Time: 07/12/16  9:31 PM  Result Value Ref Range   Lactic Acid, Venous 1.2 0.5 - 1.9 mmol/L  Procalcitonin     Status: None   Collection Time: 07/13/16  5:37 AM  Result Value Ref Range    Procalcitonin 56.62 ng/mL    Comment:        Interpretation: PCT >= 10 ng/mL: Important systemic inflammatory response, almost exclusively due to severe bacterial sepsis or septic shock. (NOTE)         ICU PCT Algorithm               Non ICU PCT Algorithm    ----------------------------     ------------------------------         PCT < 0.25 ng/mL                 PCT < 0.1 ng/mL     Stopping of antibiotics            Stopping of antibiotics       strongly encouraged.               strongly encouraged.    ----------------------------     ------------------------------       PCT level decrease by               PCT < 0.25 ng/mL       >= 80% from peak PCT       OR PCT 0.25 - 0.5 ng/mL          Stopping of antibiotics                                             encouraged.     Stopping of antibiotics           encouraged.    ----------------------------     ------------------------------       PCT level decrease by              PCT >= 0.25 ng/mL       < 80% from peak PCT        AND PCT >= 0.5 ng/mL             Continuing antibiotics                                              encouraged.       Continuing antibiotics            encouraged.    ----------------------------     ------------------------------     PCT level increase compared          PCT > 0.5 ng/mL         with  peak PCT AND          PCT >= 0.5 ng/mL             Escalation of antibiotics                                          strongly encouraged.      Escalation of antibiotics        strongly encouraged.   CBC     Status: Abnormal   Collection Time: 07/13/16  5:37 AM  Result Value Ref Range   WBC 14.9 (H) 3.6 - 11.0 K/uL   RBC 3.90 3.80 - 5.20 MIL/uL   Hemoglobin 12.7 12.0 - 16.0 g/dL   HCT 37.6 35.0 - 47.0 %   MCV 96.4 80.0 - 100.0 fL   MCH 32.5 26.0 - 34.0 pg   MCHC 33.7 32.0 - 36.0 g/dL   RDW 13.6 11.5 - 14.5 %   Platelets 124 (L) 150 - 440 K/uL  Comprehensive metabolic panel     Status: Abnormal    Collection Time: 07/13/16  5:37 AM  Result Value Ref Range   Sodium 140 135 - 145 mmol/L   Potassium 4.2 3.5 - 5.1 mmol/L   Chloride 110 101 - 111 mmol/L   CO2 23 22 - 32 mmol/L   Glucose, Bld 122 (H) 65 - 99 mg/dL   BUN 70 (H) 6 - 20 mg/dL   Creatinine, Ser 3.16 (H) 0.44 - 1.00 mg/dL   Calcium 8.1 (L) 8.9 - 10.3 mg/dL   Total Protein 5.9 (L) 6.5 - 8.1 g/dL   Albumin 2.5 (L) 3.5 - 5.0 g/dL   AST 110 (H) 15 - 41 U/L   ALT 142 (H) 14 - 54 U/L   Alkaline Phosphatase 79 38 - 126 U/L   Total Bilirubin 1.1 0.3 - 1.2 mg/dL   GFR calc non Af Amer 13 (L) >60 mL/min   GFR calc Af Amer 14 (L) >60 mL/min    Comment: (NOTE) The eGFR has been calculated using the CKD EPI equation. This calculation has not been validated in all clinical situations. eGFR's persistently <60 mL/min signify possible Chronic Kidney Disease.    Anion gap 7 5 - 15  APTT     Status: Abnormal   Collection Time: 07/13/16 11:41 AM  Result Value Ref Range   aPTT 37 (H) 24 - 36 seconds    Comment:        IF BASELINE aPTT IS ELEVATED, SUGGEST PATIENT RISK ASSESSMENT BE USED TO DETERMINE APPROPRIATE ANTICOAGULANT THERAPY.   Heparin level (unfractionated)     Status: Abnormal   Collection Time: 07/13/16 11:41 AM  Result Value Ref Range   Heparin Unfractionated 3.50 (H) 0.30 - 0.70 IU/mL    Comment:        IF HEPARIN RESULTS ARE BELOW EXPECTED VALUES, AND PATIENT DOSAGE HAS BEEN CONFIRMED, SUGGEST FOLLOW UP TESTING OF ANTITHROMBIN III LEVELS.   APTT     Status: Abnormal   Collection Time: 07/13/16  9:26 PM  Result Value Ref Range   aPTT 56 (H) 24 - 36 seconds    Comment:        IF BASELINE aPTT IS ELEVATED, SUGGEST PATIENT RISK ASSESSMENT BE USED TO DETERMINE APPROPRIATE ANTICOAGULANT THERAPY.   Procalcitonin     Status: None   Collection Time: 07/14/16  5:11 AM  Result Value Ref  Range   Procalcitonin 35.08 ng/mL    Comment:        Interpretation: PCT >= 10 ng/mL: Important systemic inflammatory  response, almost exclusively due to severe bacterial sepsis or septic shock. (NOTE)         ICU PCT Algorithm               Non ICU PCT Algorithm    ----------------------------     ------------------------------         PCT < 0.25 ng/mL                 PCT < 0.1 ng/mL     Stopping of antibiotics            Stopping of antibiotics       strongly encouraged.               strongly encouraged.    ----------------------------     ------------------------------       PCT level decrease by               PCT < 0.25 ng/mL       >= 80% from peak PCT       OR PCT 0.25 - 0.5 ng/mL          Stopping of antibiotics                                             encouraged.     Stopping of antibiotics           encouraged.    ----------------------------     ------------------------------       PCT level decrease by              PCT >= 0.25 ng/mL       < 80% from peak PCT        AND PCT >= 0.5 ng/mL             Continuing antibiotics                                              encouraged.       Continuing antibiotics            encouraged.    ----------------------------     ------------------------------     PCT level increase compared          PCT > 0.5 ng/mL         with peak PCT AND          PCT >= 0.5 ng/mL             Escalation of antibiotics                                          strongly encouraged.      Escalation of antibiotics        strongly encouraged.   Comprehensive metabolic panel     Status: Abnormal   Collection Time: 07/14/16  5:11 AM  Result Value Ref Range   Sodium 139 135 - 145 mmol/L   Potassium 3.8 3.5 - 5.1 mmol/L   Chloride  109 101 - 111 mmol/L   CO2 24 22 - 32 mmol/L   Glucose, Bld 141 (H) 65 - 99 mg/dL   BUN 70 (H) 6 - 20 mg/dL   Creatinine, Ser 2.62 (H) 0.44 - 1.00 mg/dL   Calcium 8.0 (L) 8.9 - 10.3 mg/dL   Total Protein 5.8 (L) 6.5 - 8.1 g/dL   Albumin 2.3 (L) 3.5 - 5.0 g/dL   AST 50 (H) 15 - 41 U/L   ALT 96 (H) 14 - 54 U/L   Alkaline Phosphatase 69 38  - 126 U/L   Total Bilirubin 0.8 0.3 - 1.2 mg/dL   GFR calc non Af Amer 16 (L) >60 mL/min   GFR calc Af Amer 18 (L) >60 mL/min    Comment: (NOTE) The eGFR has been calculated using the CKD EPI equation. This calculation has not been validated in all clinical situations. eGFR's persistently <60 mL/min signify possible Chronic Kidney Disease.    Anion gap 6 5 - 15  CBC     Status: Abnormal   Collection Time: 07/14/16  5:11 AM  Result Value Ref Range   WBC 13.8 (H) 3.6 - 11.0 K/uL   RBC 3.90 3.80 - 5.20 MIL/uL   Hemoglobin 12.2 12.0 - 16.0 g/dL   HCT 36.7 35.0 - 47.0 %   MCV 94.0 80.0 - 100.0 fL   MCH 31.2 26.0 - 34.0 pg   MCHC 33.2 32.0 - 36.0 g/dL   RDW 13.2 11.5 - 14.5 %   Platelets 131 (L) 150 - 440 K/uL  APTT     Status: Abnormal   Collection Time: 07/14/16  5:11 AM  Result Value Ref Range   aPTT 68 (H) 24 - 36 seconds    Comment:        IF BASELINE aPTT IS ELEVATED, SUGGEST PATIENT RISK ASSESSMENT BE USED TO DETERMINE APPROPRIATE ANTICOAGULANT THERAPY.   Heparin level (unfractionated)     Status: Abnormal   Collection Time: 07/14/16  5:11 AM  Result Value Ref Range   Heparin Unfractionated 1.06 (H) 0.30 - 0.70 IU/mL    Comment:        IF HEPARIN RESULTS ARE BELOW EXPECTED VALUES, AND PATIENT DOSAGE HAS BEEN CONFIRMED, SUGGEST FOLLOW UP TESTING OF ANTITHROMBIN III LEVELS.   Magnesium     Status: None   Collection Time: 07/14/16  5:11 AM  Result Value Ref Range   Magnesium 2.1 1.7 - 2.4 mg/dL   No components found for: ESR, C REACTIVE PROTEIN MICRO: Recent Results (from the past 720 hour(s))  Blood Culture (routine x 2)     Status: Abnormal   Collection Time: 07/11/16  9:19 PM  Result Value Ref Range Status   Specimen Description BLOOD RIGHT ANTECUBITAL  Final   Special Requests   Final    BOTTLES DRAWN AEROBIC AND ANAEROBIC Blood Culture adequate volume   Culture  Setup Time   Final    IN BOTH AEROBIC AND ANAEROBIC BOTTLES GRAM NEGATIVE RODS CRITICAL  RESULT CALLED TO, READ BACK BY AND VERIFIED WITH: SHEEMA HALLAJI ON 07/12/16 AT 1012 [place]    Culture (A)  Final    ESCHERICHIA COLI Confirmed Extended Spectrum Beta-Lactamase Producer (ESBL)    Report Status 07/14/2016 FINAL  Final   Organism ID, Bacteria ESCHERICHIA COLI  Final      Susceptibility   Escherichia coli - MIC*    AMPICILLIN >=32 RESISTANT Resistant     CEFAZOLIN >=64 RESISTANT Resistant     CEFEPIME RESISTANT Resistant  CEFTAZIDIME RESISTANT Resistant     CEFTRIAXONE >=64 RESISTANT Resistant     CIPROFLOXACIN >=4 RESISTANT Resistant     GENTAMICIN <=1 SENSITIVE Sensitive     IMIPENEM <=0.25 SENSITIVE Sensitive     TRIMETH/SULFA <=20 SENSITIVE Sensitive     AMPICILLIN/SULBACTAM 4 SENSITIVE Sensitive     PIP/TAZO <=4 SENSITIVE Sensitive     Extended ESBL POSITIVE Resistant     ERTAPENEM Value in next row Sensitive      SENSITIVE<=0.5Performed at [facility] [address] [place], [place] [address]    * ESCHERICHIA COLI  Blood Culture (routine x 2)     Status: Abnormal   Collection Time: 07/11/16  9:19 PM  Result Value Ref Range Status   Specimen Description BLOOD LEFT ANTECUBITAL  Final   Special Requests   Final    BOTTLES DRAWN AEROBIC AND ANAEROBIC Blood Culture adequate volume   Culture  Setup Time   Final    IN BOTH AEROBIC AND ANAEROBIC BOTTLES GRAM NEGATIVE RODS CRITICAL RESULT CALLED TO, READ BACK BY AND VERIFIED WITH: SHEEMA HALLAJI ON 07/12/16 AT 1012 [place]    Culture (A)  Final    ESCHERICHIA COLI SUSCEPTIBILITIES PERFORMED ON PREVIOUS CULTURE WITHIN THE LAST 5 DAYS. Performed at [facility], [place] [address], [place], [place] [address]    Report Status 07/14/2016 FINAL  Final  Blood Culture ID Panel (Reflexed)     Status: Abnormal   Collection Time: 07/11/16  9:19 PM  Result Value Ref Range Status   Enterococcus species NOT DETECTED NOT DETECTED Final   Listeria monocytogenes NOT DETECTED NOT DETECTED Final   Staphylococcus species  NOT DETECTED NOT DETECTED Final   Staphylococcus aureus NOT DETECTED NOT DETECTED Final   Streptococcus species NOT DETECTED NOT DETECTED Final   Streptococcus agalactiae NOT DETECTED NOT DETECTED Final   Streptococcus pneumoniae NOT DETECTED NOT DETECTED Final   Streptococcus pyogenes NOT DETECTED NOT DETECTED Final   Acinetobacter baumannii NOT DETECTED NOT DETECTED Final   Enterobacteriaceae species DETECTED (A) NOT DETECTED Final    Comment: Enterobacteriaceae represent a large family of gram-negative bacteria, not a single organism. CRITICAL RESULT CALLED TO, READ BACK BY AND VERIFIED WITH: SHEEMA HALLAJI ON 07/12/16 [place]    Enterobacter cloacae complex NOT DETECTED NOT DETECTED Final   Escherichia coli DETECTED (A) NOT DETECTED Final    Comment: CRITICAL RESULT CALLED TO, READ BACK BY AND VERIFIED WITH: SHEEMA HALLAJI ON 07/12/16 [place]    Klebsiella oxytoca NOT DETECTED NOT DETECTED Final   Klebsiella pneumoniae NOT DETECTED NOT DETECTED Final   Proteus species NOT DETECTED NOT DETECTED Final   Serratia marcescens NOT DETECTED NOT DETECTED Final   Carbapenem resistance NOT DETECTED NOT DETECTED Final   Haemophilus influenzae NOT DETECTED NOT DETECTED Final   Neisseria meningitidis NOT DETECTED NOT DETECTED Final   Pseudomonas aeruginosa NOT DETECTED NOT DETECTED Final   Candida albicans NOT DETECTED NOT DETECTED Final   Candida glabrata NOT DETECTED NOT DETECTED Final   Candida krusei NOT DETECTED NOT DETECTED Final   Candida parapsilosis NOT DETECTED NOT DETECTED Final   Candida tropicalis NOT DETECTED NOT DETECTED Final  Gastrointestinal Panel by PCR , Stool     Status: None   Collection Time: 07/12/16 12:00 AM  Result Value Ref Range Status   Campylobacter species NOT DETECTED NOT DETECTED Final   Plesimonas shigelloides NOT DETECTED NOT DETECTED Final   Salmonella species NOT DETECTED NOT DETECTED Final   Yersinia enterocolitica NOT  DETECTED NOT DETECTED Final   Vibrio  species NOT DETECTED NOT DETECTED Final   Vibrio cholerae NOT DETECTED NOT DETECTED Final   Enteroaggregative E coli (EAEC) NOT DETECTED NOT DETECTED Final   Enteropathogenic E coli (EPEC) NOT DETECTED NOT DETECTED Final   Enterotoxigenic E coli (ETEC) NOT DETECTED NOT DETECTED Final   Shiga like toxin producing E coli (STEC) NOT DETECTED NOT DETECTED Final   Shigella/Enteroinvasive E coli (EIEC) NOT DETECTED NOT DETECTED Final   Cryptosporidium NOT DETECTED NOT DETECTED Final   Cyclospora cayetanensis NOT DETECTED NOT DETECTED Final   Entamoeba histolytica NOT DETECTED NOT DETECTED Final   Giardia lamblia NOT DETECTED NOT DETECTED Final   Adenovirus F40/41 NOT DETECTED NOT DETECTED Final   Astrovirus NOT DETECTED NOT DETECTED Final   Norovirus GI/GII NOT DETECTED NOT DETECTED Final   Rotavirus A NOT DETECTED NOT DETECTED Final   Sapovirus (I, II, IV, and V) NOT DETECTED NOT DETECTED Final  C difficile quick scan w PCR reflex     Status: None   Collection Time: 07/12/16 12:00 AM  Result Value Ref Range Status   C Diff antigen NEGATIVE NEGATIVE Final   C Diff toxin NEGATIVE NEGATIVE Final   C Diff interpretation No C. difficile detected.  Final  MRSA PCR Screening     Status: None   Collection Time: 07/12/16  2:10 AM  Result Value Ref Range Status   MRSA by PCR NEGATIVE NEGATIVE Final    Comment:        The GeneXpert MRSA Assay (FDA approved for NASAL specimens only), is one component of a comprehensive MRSA colonization surveillance program. It is not intended to diagnose MRSA infection nor to guide or monitor treatment for MRSA infections.   Urine culture     Status: Abnormal   Collection Time: 07/12/16  7:15 AM  Result Value Ref Range Status   Specimen Description URINE, RANDOM  Final   Special Requests NONE  Final   Culture (A)  Final    >=100,000 COLONIES/mL ESCHERICHIA COLI Confirmed Extended Spectrum Beta-Lactamase Producer (ESBL) Performed at [facility], [place] [address], [place], [place] [address]    Report Status 07/14/2016 FINAL  Final   Organism ID, Bacteria ESCHERICHIA COLI (A)  Final      Susceptibility   Escherichia coli - MIC*    AMPICILLIN >=32 RESISTANT Resistant     CEFAZOLIN >=64 RESISTANT Resistant     CEFTRIAXONE >=64 RESISTANT Resistant     CIPROFLOXACIN >=4 RESISTANT Resistant     GENTAMICIN <=1 SENSITIVE Sensitive     IMIPENEM <=0.25 SENSITIVE Sensitive     NITROFURANTOIN <=16 SENSITIVE Sensitive     TRIMETH/SULFA <=20 SENSITIVE Sensitive     AMPICILLIN/SULBACTAM 4 SENSITIVE Sensitive     PIP/TAZO <=4 SENSITIVE Sensitive     Extended ESBL POSITIVE Resistant     * >=100,000 COLONIES/mL ESCHERICHIA COLI    IMAGING: Dg Chest 1 View  Result Date: 07/13/2016 CLINICAL DATA:  Dyspnea. EXAM: CHEST 1 VIEW COMPARISON:  07/11/2016 FINDINGS: Shallow lung inflation. Heart enlarged. Prominent pulmonary artery segment is stable. Patient is rotated. There are no focal consolidations or pleural effusions. No evidence for pulmonary edema. Perihilar bronchitic changes are stable. IMPRESSION: 1. Shallow inflation. 2. Cardiomegaly without pulmonary edema. Electronically Signed   By: Nolon Nations M.D.   On: 07/13/2016 08:40   Dg Chest 2 View  Result Date: 07/11/2016 CLINICAL DATA:  Sepsis EXAM: CHEST  2 VIEW COMPARISON:  07/02/2014 FINDINGS: Chronic  cardiomegaly and aortic tortuosity. Low lung volumes with no focal airspace disease. No edema, effusion, or pneumothorax. IMPRESSION: Limited low volume chest without definitive pneumonia. Electronically Signed   By: Monte Fantasia M.D.   On: 07/11/2016 19:09   US Renal  Result Date: 07/12/2016 CLINICAL DATA:  Acute renal failure. EXAM: RENAL / URINARY TRACT ULTRASOUND COMPLETE COMPARISON:  CT of the abdomen and pelvis on 07/01/2014 FINDINGS: Right Kidney: Length: 11.8 cm. Cyst in the midpole region is 3.3 x 3.1 x 3.8 cm. Renal parenchyma is normal echogenicity. No hydronephrosis or solid  renal mass appear Left Kidney: Length: 13.0 cm.   No focal renal mass or hydronephrosis. Bladder: The bladder is decompressed by a Foley catheter. IMPRESSION: 1. Right renal cyst. 2. No hydronephrosis. 3. Foley catheter Electronically Signed   By: Nolon Nations M.D.   On: 07/12/2016 18:29   US Abdomen Limited Ruq  Result Date: 07/11/2016 CLINICAL DATA:  Elevated LFTs, lipase and white count EXAM: ULTRASOUND ABDOMEN LIMITED RIGHT UPPER QUADRANT COMPARISON:  07/01/2014 FINDINGS: Gallbladder: Multiple shadowing stones within the gallbladder measuring up to 9 mm. Gallbladder wall thickening up to 5.7 mm. Positive sonographic Murphy. Common bile duct: Diameter: 3.7 mm Liver: Increased echogenicity consistent with fatty infiltration. Incidentally noted is cortical thinning of the right kidney with cysts measuring up to 1.5 cm. IMPRESSION: 1. Multiple gallstones with wall thickening and positive sonographic Murphy sign, findings are suspicious for an acute cholecystitis. No biliary dilatation 2. Echogenic liver consistent with fatty infiltration 3. Right renal cysts Electronically Signed   By: Donavan Foil M.D.   On: 07/11/2016 23:53    Assessment:   Kirsten Tran is a 83 y.o. female with recurrent E coli bacteremia in setting of UTI and cholecystitis. Not a great operative candidate and surgery is not planned.  Despite the organism appearing sensitive to amp sulbactam I would not treat with any betalactams other than a carbapenem as will likely fail in vivo. Will need picc and likely 14 days IV ertapenem. This should cover the likely polymicrobial cholecystitis as well. Following this treatment may need further oral abx treatment (bactrim/flagyl)  for her chole if no plans for surgery.   Recommendations Place picc once Cr improved- may need neck IJ if renal thinks may eventually need HD. Restart meropenem as inpt - change to IV ertapenem as otpt.  Thank you very much for allowing me to  participate in the care of this patient. Please call with questions.   Cheral Marker. Ola Spurr, MD

## 2016-07-14 NOTE — Progress Notes (Signed)
[facility]  ROUNDING NOTE   Subjective:   S Creatinine has improved slightly UOP 1950 cc Foley inplace  Objective:  Vital signs in last 24 hours:  Temp:  [98.1 F (36.7 C)-98.3 F (36.8 C)] 98.2 F (36.8 C) (06/18 1214) Pulse Rate:  [87-115] 87 (06/18 1214) Resp:  [18-22] 18 (06/18 1214) BP: (98-136)/(58-90) 98/58 (06/18 1214) SpO2:  [97 %-98 %] 98 % (06/18 1214)  Weight change:  Filed Weights   07/11/16 1736  Weight: 110.2 kg (242 lb 14.4 oz)    Intake/Output: I/O last 3 completed shifts: In: 519 [P.O.:360; I.V.:59; IV Piggyback:100] Out: 2900 [Urine:2900]   Intake/Output this shift:  Total I/O In: -  Out: 650 [Urine:650]  Physical Exam: General: NAD, laying in bed  Head: Normocephalic, atraumatic.    Eyes: Anicteric,    Neck: Supple, trachea midline  Lungs:  Clear to auscultation, O2 by [place]  Heart: Regular rate and rhythm  Abdomen:  Soft, nontender,   Extremities: no peripheral edema.  Neurologic: Oriented to self, following commands   Foley        Basic Metabolic Panel:  Recent Labs Lab 07/11/16 1905 07/12/16 0449 07/13/16 0537 07/14/16 0511  NA 141 141 140 139  K 4.3 4.4 4.2 3.8  CL 102 108 110 109  CO2 28 24 23 24   GLUCOSE 177* 175* 122* 141*  BUN 61* 65* 70* 70*  CREATININE 3.01* 3.03* 3.16* 2.62*  CALCIUM 9.6 8.5* 8.1* 8.0*  MG  --  1.6*  --  2.1  PHOS  --  3.7  --   --     Liver Function Tests:  Recent Labs Lab 07/11/16 1905 07/12/16 0449 07/13/16 0537 07/14/16 0511  AST 381* 274* 110* 50*  ALT 282* 223* 142* 96*  ALKPHOS 70 61 79 69  BILITOT 3.4* 2.1* 1.1 0.8  PROT 6.9 6.0* 5.9* 5.8*  ALBUMIN 3.2* 2.8* 2.5* 2.3*    Recent Labs Lab 07/11/16 1905  LIPASE 1,116*   No results for input(s): AMMONIA in the last 168 hours.  CBC:  Recent Labs Lab 07/11/16 1905 07/12/16 0449 07/13/16 0537 07/14/16 0511  WBC 24.5* 19.8* 14.9* 13.8*  NEUTROABS 22.5*  --   --   --   HGB 13.9 13.1 12.7 12.2  HCT 41.3  39.0 37.6 36.7  MCV 94.2 95.0 96.4 94.0  PLT 182 151 124* 131*    Cardiac Enzymes: No results for input(s): CKTOTAL, CKMB, CKMBINDEX, TROPONINI in the last 168 hours.  BNP: Invalid input(s): POCBNP  CBG:  Recent Labs Lab 07/12/16 0212  GLUCAP 106*    Microbiology: Results for orders placed or performed during the hospital encounter of 07/11/16  Blood Culture (routine x 2)     Status: Abnormal   Collection Time: 07/11/16  9:19 PM  Result Value Ref Range Status   Specimen Description BLOOD RIGHT ANTECUBITAL  Final   Special Requests   Final    BOTTLES DRAWN AEROBIC AND ANAEROBIC Blood Culture adequate volume   Culture  Setup Time   Final    IN BOTH AEROBIC AND ANAEROBIC BOTTLES GRAM NEGATIVE RODS CRITICAL RESULT CALLED TO, READ BACK BY AND VERIFIED WITH: SHEEMA HALLAJI ON 07/12/16 AT 1012 SRC    Culture (A)  Final    ESCHERICHIA COLI Confirmed Extended Spectrum Beta-Lactamase Producer (ESBL)    Report Status 07/14/2016 FINAL  Final   Organism ID, Bacteria ESCHERICHIA COLI  Final      Susceptibility   Escherichia coli - MIC*  AMPICILLIN >=32 RESISTANT Resistant     CEFAZOLIN >=64 RESISTANT Resistant     CEFEPIME RESISTANT Resistant     CEFTAZIDIME RESISTANT Resistant     CEFTRIAXONE >=64 RESISTANT Resistant     CIPROFLOXACIN >=4 RESISTANT Resistant     GENTAMICIN <=1 SENSITIVE Sensitive     IMIPENEM <=0.25 SENSITIVE Sensitive     TRIMETH/SULFA <=20 SENSITIVE Sensitive     AMPICILLIN/SULBACTAM 4 SENSITIVE Sensitive     PIP/TAZO <=4 SENSITIVE Sensitive     Extended ESBL POSITIVE Resistant     ERTAPENEM Value in next row Sensitive      SENSITIVE<=0.5Performed at [facility], [address] [place], [place] [address]    * ESCHERICHIA COLI  Blood Culture (routine x 2)     Status: Abnormal   Collection Time: 07/11/16  9:19 PM  Result Value Ref Range Status   Specimen Description BLOOD LEFT ANTECUBITAL  Final   Special Requests   Final    BOTTLES DRAWN  AEROBIC AND ANAEROBIC Blood Culture adequate volume   Culture  Setup Time   Final    IN BOTH AEROBIC AND ANAEROBIC BOTTLES GRAM NEGATIVE RODS CRITICAL RESULT CALLED TO, READ BACK BY AND VERIFIED WITH: SHEEMA HALLAJI ON 07/12/16 AT 1012 SRC    Culture (A)  Final    ESCHERICHIA COLI SUSCEPTIBILITIES PERFORMED ON PREVIOUS CULTURE WITHIN THE LAST 5 DAYS. Performed at [facility], [address] [place], [place] [address]    Report Status 07/14/2016 FINAL  Final  Blood Culture ID Panel (Reflexed)     Status: Abnormal   Collection Time: 07/11/16  9:19 PM  Result Value Ref Range Status   Enterococcus species NOT DETECTED NOT DETECTED Final   Listeria monocytogenes NOT DETECTED NOT DETECTED Final   Staphylococcus species NOT DETECTED NOT DETECTED Final   Staphylococcus aureus NOT DETECTED NOT DETECTED Final   Streptococcus species NOT DETECTED NOT DETECTED Final   Streptococcus agalactiae NOT DETECTED NOT DETECTED Final   Streptococcus pneumoniae NOT DETECTED NOT DETECTED Final   Streptococcus pyogenes NOT DETECTED NOT DETECTED Final   Acinetobacter baumannii NOT DETECTED NOT DETECTED Final   Enterobacteriaceae species DETECTED (A) NOT DETECTED Final    Comment: Enterobacteriaceae represent a large family of gram-negative bacteria, not a single organism. CRITICAL RESULT CALLED TO, READ BACK BY AND VERIFIED WITH: SHEEMA HALLAJI ON 07/12/16 SRC    Enterobacter cloacae complex NOT DETECTED NOT DETECTED Final   Escherichia coli DETECTED (A) NOT DETECTED Final    Comment: CRITICAL RESULT CALLED TO, READ BACK BY AND VERIFIED WITH: SHEEMA HALLAJI ON 07/12/16 SRC    Klebsiella oxytoca NOT DETECTED NOT DETECTED Final   Klebsiella pneumoniae NOT DETECTED NOT DETECTED Final   Proteus species NOT DETECTED NOT DETECTED Final   Serratia marcescens NOT DETECTED NOT DETECTED Final   Carbapenem resistance NOT DETECTED NOT DETECTED Final   Haemophilus influenzae NOT DETECTED NOT DETECTED Final    Neisseria meningitidis NOT DETECTED NOT DETECTED Final   Pseudomonas aeruginosa NOT DETECTED NOT DETECTED Final   Candida albicans NOT DETECTED NOT DETECTED Final   Candida glabrata NOT DETECTED NOT DETECTED Final   Candida krusei NOT DETECTED NOT DETECTED Final   Candida parapsilosis NOT DETECTED NOT DETECTED Final   Candida tropicalis NOT DETECTED NOT DETECTED Final  Gastrointestinal Panel by PCR , Stool     Status: None   Collection Time: 07/12/16 12:00 AM  Result Value Ref Range Status   Campylobacter species NOT DETECTED NOT DETECTED  Final   Plesimonas shigelloides NOT DETECTED NOT DETECTED Final   Salmonella species NOT DETECTED NOT DETECTED Final   Yersinia enterocolitica NOT DETECTED NOT DETECTED Final   Vibrio species NOT DETECTED NOT DETECTED Final   Vibrio cholerae NOT DETECTED NOT DETECTED Final   Enteroaggregative E coli (EAEC) NOT DETECTED NOT DETECTED Final   Enteropathogenic E coli (EPEC) NOT DETECTED NOT DETECTED Final   Enterotoxigenic E coli (ETEC) NOT DETECTED NOT DETECTED Final   Shiga like toxin producing E coli (STEC) NOT DETECTED NOT DETECTED Final   Shigella/Enteroinvasive E coli (EIEC) NOT DETECTED NOT DETECTED Final   Cryptosporidium NOT DETECTED NOT DETECTED Final   Cyclospora cayetanensis NOT DETECTED NOT DETECTED Final   Entamoeba histolytica NOT DETECTED NOT DETECTED Final   Giardia lamblia NOT DETECTED NOT DETECTED Final   Adenovirus F40/41 NOT DETECTED NOT DETECTED Final   Astrovirus NOT DETECTED NOT DETECTED Final   Norovirus GI/GII NOT DETECTED NOT DETECTED Final   Rotavirus A NOT DETECTED NOT DETECTED Final   Sapovirus (I, II, IV, and V) NOT DETECTED NOT DETECTED Final  C difficile quick scan w PCR reflex     Status: None   Collection Time: 07/12/16 12:00 AM  Result Value Ref Range Status   C Diff antigen NEGATIVE NEGATIVE Final   C Diff toxin NEGATIVE NEGATIVE Final   C Diff interpretation No C. difficile detected.  Final  MRSA PCR Screening      Status: None   Collection Time: 07/12/16  2:10 AM  Result Value Ref Range Status   MRSA by PCR NEGATIVE NEGATIVE Final    Comment:        The GeneXpert MRSA Assay (FDA approved for NASAL specimens only), is one component of a comprehensive MRSA colonization surveillance program. It is not intended to diagnose MRSA infection nor to guide or monitor treatment for MRSA infections.   Urine culture     Status: Abnormal   Collection Time: 07/12/16  7:15 AM  Result Value Ref Range Status   Specimen Description URINE, RANDOM  Final   Special Requests NONE  Final   Culture (A)  Final    >=100,000 COLONIES/mL ESCHERICHIA COLI Confirmed Extended Spectrum Beta-Lactamase Producer (ESBL) Performed at [facility], [address] [place], [place] [address]    Report Status 07/14/2016 FINAL  Final   Organism ID, Bacteria ESCHERICHIA COLI (A)  Final      Susceptibility   Escherichia coli - MIC*    AMPICILLIN >=32 RESISTANT Resistant     CEFAZOLIN >=64 RESISTANT Resistant     CEFTRIAXONE >=64 RESISTANT Resistant     CIPROFLOXACIN >=4 RESISTANT Resistant     GENTAMICIN <=1 SENSITIVE Sensitive     IMIPENEM <=0.25 SENSITIVE Sensitive     NITROFURANTOIN <=16 SENSITIVE Sensitive     TRIMETH/SULFA <=20 SENSITIVE Sensitive     AMPICILLIN/SULBACTAM 4 SENSITIVE Sensitive     PIP/TAZO <=4 SENSITIVE Sensitive     Extended ESBL POSITIVE Resistant     * >=100,000 COLONIES/mL ESCHERICHIA COLI    Coagulation Studies:  Recent Labs  07/12/16 0449  LABPROT 21.8*  INR 1.87    Urinalysis:  Recent Labs  07/12/16 0715  COLORURINE AMBER*  LABSPEC 1.013  PHURINE 5.0  GLUCOSEU NEGATIVE  HGBUR LARGE*  BILIRUBINUR NEGATIVE  KETONESUR NEGATIVE  PROTEINUR 100*  NITRITE NEGATIVE  LEUKOCYTESUR LARGE*      Imaging: Dg Chest 1 View  Result Date: 07/13/2016 CLINICAL DATA:  Dyspnea. EXAM: CHEST 1 VIEW COMPARISON:  07/11/2016 FINDINGS: Shallow lung inflation. Heart enlarged. Prominent  pulmonary artery segment is stable. Patient is rotated. There are no focal consolidations or pleural effusions. No evidence for pulmonary edema. Perihilar bronchitic changes are stable. IMPRESSION: 1. Shallow inflation. 2. Cardiomegaly without pulmonary edema. Electronically Signed   By: Norva PavlovElizabeth  Brown M.D.   On: 07/13/2016 08:40     Medications:   . ampicillin-sulbactam (UNASYN) IV    . magnesium sulfate 1 - 4 g bolus IVPB     . ARIPiprazole  5 mg Oral Daily  . brimonidine  1 drop Both Eyes Daily   And  . timolol  1 drop Both Eyes Daily  . carvedilol  3.125 mg Oral BID WC  . latanoprost  1 drop Both Eyes QHS  . levothyroxine  25 mcg Oral QAC breakfast  . mouth rinse  15 mL Mouth Rinse BID  . mometasone-formoterol  2 puff Inhalation BID  . rivaroxaban  15 mg Oral Daily   acetaminophen, HYDROmorphone (DILAUDID) injection, ondansetron **OR** ondansetron (ZOFRAN) IV, oxyCODONE  Assessment/ Plan:  Kirsten Tran is a 83 y.o.  female Ms. Charolette ForwardMarjorie J Buschman is a 83 y.o. white female with dementia, parkinson's, bipolar disorder with history of lithium, obstructive sleep apnea on CPAP, hyperlipidemia, hypertension, atrial fibrillation, osteopenia, CVA, who [facility] for gallstone pancreatitis  1. Acute renal failure on chronic kidney disease stage III: baseline creatinine of 0.99 GFR 55 06/2014 Acute renal failure with sepsis/urinary tract infection.   -  Urine output appears to have improved - Serum creatinine improved slightly to 2.62 - Continue supportive care  2. Sepsis with urinary tract infection and acute pancreatitis/cholecystitis: empiric vancomycin and meropenem.  - Appreciate surgery input - gram negative rods in blood culture. Recent E. Coli UTI with sepsis on 6/3     LOS: 3 Clint Strupp 6/18/20184:22 PM

## 2016-07-14 NOTE — Progress Notes (Addendum)
Pharmacy Antibiotic Note  Kirsten ForwardMarjorie J Tran is a 83 y.o. female admitted on 07/11/2016 with sepsis. BCID positive for E.Coli. Pharmacy has been consulted for Meropenem dosing.  Now changed to Unasyn. Called microlab who stated E coli in blood is also sensitive to ertapenem. Text paged MD awaiting call back. ID consult request noted.   Plan: Based on current renal function, will start patient on Unasyn 3g IV every 12 hours.  Will monitor renal function and adjust dose as needed.   Height: 5\' 3"  (160 cm) Weight: 242 lb 14.4 oz (110.2 kg) IBW/kg (Calculated) : 52.4  Temp (24hrs), Avg:98.2 F (36.8 C), Min:98.1 F (36.7 C), Max:98.3 F (36.8 C)   Recent Labs Lab 07/11/16 1905 07/11/16 2358 07/12/16 0449 07/12/16 2131 07/13/16 0537 07/14/16 0511  WBC 24.5*  --  19.8*  --  14.9* 13.8*  CREATININE 3.01*  --  3.03*  --  3.16* 2.62*  LATICACIDVEN 2.2* 2.3*  --  1.2  --   --     Estimated Creatinine Clearance: 19.1 mL/min (A) (by C-G formula based on SCr of 2.62 mg/dL (H)).    No Known Allergies  Antimicrobials this admission: 6/15 Zosyn >> 6/16 6/16 Metronidazole/Vancomcyin >> 6/16 6/16 meropenem >> 6/18 Unasyn 6/18 >>  Dose adjustments this admission:   Microbiology results: 6/15 BCx: Ecoli  ESBL 6/16 BCID: E.Coli 6/16 UCx: ESBL Ecoli 6/15MRSA PCR: negative   Thank you for allowing pharmacy to be a part of this patient's care.  Marty HeckWang, Anjannette Gauger L, PharmD, BCPS Clinical Pharmacist 07/14/2016 10:26 AM

## 2016-07-14 NOTE — Evaluation (Signed)
Physical Therapy Evaluation Patient Details Name: Kirsten Tran MRN: [id number] DOB: [birth year] Today's Date: 07/14/2016   History of Present Illness  Pt is an 83 y.o.femalewho presents with malaise and weakness. She has dementia and is unable to contribute much to her history of present illness. She was recently admitted here for sepsis due to UTI and pneumonia. She was found to have cholecystitis at that time. She was sent home on Levaquin. She presents again today with pancreatitis and persistent cholecystitis. She meets sepsis criteria. Hospitalists were called for admission.  Assessment includes: ESBL Escherichia coli Sepsis from gallbladder source, Pancreatitis, Cholecystitis, and AKI lkely from dehydration.  No surgical intervention recommended at time of PT eval secondary to pt's clinical condition improving.    Clinical Impression  Pt presents with deficits in strength, transfers, mobility, gait, balance, and activity tolerance.  Pt required Mod A with bed mobility tasks with mod verbal and tactile cues for sequencing.  Pt required Min A to stand from elevated EOB with Mod verbal and tactile cues for sequencing with pt then having difficulty transferring hands from EOB to RW once in standing.  Pt able to stand and perform 4-5 marches in place with minimal foot clearance from floor but was noted to be SOB with HR increasing from baseline of 88 bpm to 130 bpm.  Pt returned to sitting with nsg informed.  Pt's HR steadily lowered once in sitting and returned to near baseline levels in around 60-90 sec.  Pt will benefit from HHPT services upon discharge at pt's LTC facility to address above deficits for decreased caregiver assistance and return to pt's PLOF.        Follow Up Recommendations Home health PT;Supervision/Assistance - 24 hour (HHPT at pt's LTC facility)    Equipment Recommendations  None recommended by PT (Pt may benefit from bariatric RW, unsure if pt currently has access to  RW at LTC facility)    Recommendations for Other Services       Precautions / Restrictions Precautions Precautions: Fall Restrictions Weight Bearing Restrictions: No      Mobility  Bed Mobility Overal bed mobility: Needs Assistance Bed Mobility: Supine to Sit;Sit to Supine     Supine to sit: Mod assist Sit to supine: Mod assist      Transfers Overall transfer level: Needs assistance Equipment used: Rolling walker (2 wheeled) Transfers: Sit to/from Stand Sit to Stand: Min assist         General transfer comment: Mod verbal and tactile cues for proper sequencing.  Pt had difficulty bringing BUEs from bed up to RW.  Ambulation/Gait             General Gait Details: Pt able to take several marches in place at EOB with minimal foot clearance but HR elevated from 88 bpm at baseline to 130 bpm, pt returned to sitting, nsg notified.   Stairs            Wheelchair Mobility    Modified Rankin (Stroke Patients Only)       Balance Overall balance assessment: Needs assistance Sitting-balance support: Feet supported;Bilateral upper extremity supported Sitting balance-Leahy Scale: Fair     Standing balance support: Bilateral upper extremity supported Standing balance-Leahy Scale: Fair                               Pertinent Vitals/Pain Pain Assessment: No/denies pain    Home Living Family/patient expects to be discharged  to:: Other (Comment) (Daughter Chandra BatchKaren Johnson called for history secondary to pt being a poor historian.  Pt is a resident at [facility].)                 Additional Comments[facility]    Prior Function Level of Independence: Needs assistance   Gait / Transfers Assistance Needed: Per daughter pt's baseline is CGA during transfers and HHA with ambulation 4-5 steps for toileting and transfers from one surface to another.  Pt Ind at baseline with sup to/from sit.  ADL's / Homemaking  Assistance Needed: Assistance from LTC staff for all ADL needs        Hand Dominance        Extremity/Trunk Assessment   Upper Extremity Assessment Upper Extremity Assessment: Generalized weakness    Lower Extremity Assessment Lower Extremity Assessment: Generalized weakness       Communication   Communication: Expressive difficulties  Cognition Arousal/Alertness: Awake/alert Behavior During Therapy: WFL for tasks assessed/performed Overall Cognitive Status: No family/caregiver present to determine baseline cognitive functioning                                 General Comments: Pt with a h/o dementia but unable to determine if at baseline      General Comments      Exercises Total Joint Exercises Ankle Circles/Pumps: AROM;Both;5 reps;10 reps Quad Sets: Strengthening;Both;10 reps Gluteal Sets: Strengthening;Both;5 reps Heel Slides: AAROM;Both;10 reps Hip ABduction/ADduction: AAROM;Both;10 reps Straight Leg Raises: AAROM;Both;10 reps Long Arc Quad: AROM;Both;10 reps Marching in Standing: AROM;Both;5 reps   Assessment/Plan    PT Assessment Patient needs continued PT services  PT Problem List Decreased strength;Decreased activity tolerance;Decreased balance;Decreased mobility       PT Treatment Interventions Gait training;Functional mobility training;Neuromuscular re-education;Balance training;Therapeutic exercise;Therapeutic activities;Patient/family education    PT Goals (Current goals can be found in the Care Plan section)  Acute Rehab PT Goals Patient Stated Goal: Pt stated desire to walk better PT Goal Formulation: With patient Time For Goal Achievement: 07/27/16 Potential to Achieve Goals: Fair    Frequency Min 2X/week   Barriers to discharge        Co-evaluation               AM-PAC PT "6 Clicks" Daily Activity  Outcome Measure Difficulty turning over in bed (including adjusting bedclothes, sheets and blankets)?:  Total Difficulty moving from lying on back to sitting on the side of the bed? : Total Difficulty sitting down on and standing up from a chair with arms (e.g., wheelchair, bedside commode, etc,.)?: Total Help needed moving to and from a bed to chair (including a wheelchair)?: A Lot Help needed walking in hospital room?: Total Help needed climbing 3-5 steps with a railing? : Total 6 Click Score: 7    End of Session Equipment Utilized During Treatment: Gait belt;Oxygen Activity Tolerance: Patient limited by fatigue;Treatment limited secondary to medical complications (Comment) (Increased HR from 88 bpm in sitting to 130 bpm in standing) Patient left: in bed;with bed alarm set;with call bell/phone within reach Nurse Communication: Mobility status;Other (comment) (HR response to activity) PT Visit Diagnosis: Difficulty in walking, not elsewhere classified (R26.2);Muscle weakness (generalized) (M62.81)    Time: [id number] PT Time Calculation (min) (ACUTE ONLY): 43 min   Charges:   PT Evaluation $PT Eval Low Complexity: 1 Procedure PT Treatments $Therapeutic Exercise: 8-22 mins   PT G Codes:  Ovidio Hanger PT, DPT 07/14/16, 5:10 PM

## 2016-07-15 ENCOUNTER — Inpatient Hospital Stay: Payer: Medicare Other

## 2016-07-15 LAB — CBC
HCT: 37.2 % (ref 35.0–47.0)
Hemoglobin: 12.4 g/dL (ref 12.0–16.0)
MCH: 31.8 pg (ref 26.0–34.0)
MCHC: 33.3 g/dL (ref 32.0–36.0)
MCV: 95.4 fL (ref 80.0–100.0)
PLATELETS: 132 10*3/uL — AB (ref 150–440)
RBC: 3.89 MIL/uL (ref 3.80–5.20)
RDW: 13.1 % (ref 11.5–14.5)
WBC: 12 10*3/uL — ABNORMAL HIGH (ref 3.6–11.0)

## 2016-07-15 LAB — BASIC METABOLIC PANEL
Anion gap: 5 (ref 5–15)
BUN: 68 mg/dL — AB (ref 6–20)
CO2: 24 mmol/L (ref 22–32)
CREATININE: 2.26 mg/dL — AB (ref 0.44–1.00)
Calcium: 8.3 mg/dL — ABNORMAL LOW (ref 8.9–10.3)
Chloride: 111 mmol/L (ref 101–111)
GFR calc Af Amer: 22 mL/min — ABNORMAL LOW (ref >60)
GFR, EST NON AFRICAN AMERICAN: 19 mL/min — AB (ref >60)
GLUCOSE: 140 mg/dL — AB (ref 65–99)
Potassium: 3.8 mmol/L (ref 3.5–5.1)
SODIUM: 140 mmol/L (ref 135–145)

## 2016-07-15 MED ORDER — ENSURE ENLIVE PO LIQD: 237.0000 mL | Freq: Three times a day (TID) | ORAL | 12 refills | Status: AC | PRN

## 2016-07-15 MED ORDER — CARVEDILOL 3.125 MG PO TABS: 3.1250 mg | ORAL_TABLET | Freq: Two times a day (BID) | ORAL | 0 refills | Status: AC

## 2016-07-15 MED ORDER — SODIUM CHLORIDE 0.9 % IV SOLN: 500.0000 mg | INTRAVENOUS | 0 refills | Status: AC

## 2016-07-15 MED ORDER — SODIUM CHLORIDE 0.9% FLUSH: 10.0000 mL | INTRAVENOUS | Status: DC | PRN

## 2016-07-15 MED ORDER — SODIUM CHLORIDE 0.9% FLUSH: 10.0000 mL | Freq: Two times a day (BID) | INTRAVENOUS | Status: DC

## 2016-07-15 MED ORDER — SODIUM CHLORIDE 0.9 % IV SOLN
500.0000 mg | INTRAVENOUS | Status: DC
  Filled 2016-07-15: qty 0.5

## 2016-07-15 MED ORDER — PRAVASTATIN SODIUM 10 MG PO TABS: 20.0000 mg | ORAL_TABLET | Freq: Every day | ORAL | 0 refills | Status: AC

## 2016-07-15 MED ORDER — RIVAROXABAN 15 MG PO TABS: 15.0000 mg | ORAL_TABLET | Freq: Every day | ORAL | 0 refills | Status: AC

## 2016-07-15 NOTE — Discharge Summary (Addendum)
[facility] [place] [facility]   PATIENT NAME: Kirsten GuessMarjorie Tran    MR#:  [id number]  DATE OF BIRTH:  1930/12/23  DATE OF ADMISSION:  07/11/2016 ADMITTING PHYSICIAN: Oralia Manisavid Willis, MD  DATE OF DISCHARGE: 07/15/2016  PRIMARY CARE PHYSICIAN: PCP at [facility]    ADMISSION DIAGNOSIS:  Elevated LFTs [R79.89] Gallstone pancreatitis [K85.10] AKI (acute kidney injury) (HCC) [N17.9] Sepsis (HCC) [A41.9]  DISCHARGE DIAGNOSIS:  Principal Problem:   Sepsis (HCC) Active Problems:   Dementia   Pancreatitis   Cholecystitis   HTN (hypertension)   HLD (hyperlipidemia)   Hypothyroidism   AKI (acute kidney injury) (HCC)   SECONDARY DIAGNOSIS:   Past Medical History:  Diagnosis Date  . Atrial fibrillation, unspecified   . Dementia   . Essential hypertension   . Hypercholesteremia   . Insomnia   . Major depression, recurrent, chronic (HCC)   . Mood disorder (HCC)   . Parkinson disease (HCC)   . Renal disorder   . Renal insufficiency   . Schizoaffective disorder, bipolar type (HCC)   . Stroke [facility](HCC)     HOSPITAL COURSE:  83 year old female with Parkinson's disease and dementia who presents with generalized weakness and found of sepsis from acute cholecystitis  1. ESBL Escherichia coli Sepsis from gallbladder source/Cholecystitis: She presents with elevated lactic acid, leukocytosis, tachycardia  Blood cultures are positive for Escherichia coli Patient ID consultation as well as surgical evaluation. If it shows recommending PICC line in 14 days of IV ertapenem. Nephrology was okay with PICC line insertion. Following this treatment patient may need further oral antibiotics with Bactrim/Flagyl. She will  follow-up with Dr. Sampson GoonFitzgerald in 2 weeks. PICC line can really removed after 2 weeks therapy. PICC line care as routine. 2. Acute cholecystitis: Patient symptoms including abdominal pain and nausea have improved. Patient is about by surgery. She is  not a surgical candidate. Plan as outlined above.  3. Atrial fibrillation : Heart rate better controlled with Coreg She will continue on oral anticoagulation 4. Acute kidney injury in the setting of sepsis Creatinine has improved  Renal ultrasound without hydronephrosis She will have follow-up with Dr. Thedore MinsSingh in one week. All nephrotoxic medications have been reviewed including lithium. 5. Elevated LFTs due to acute cholecystitis This has improved. 6. Leukocytosis: Slowly resolving this is due to sepsis.  7. Chronic respiratory failure on 2 L of oxygen  Patient is at high risk of returning back to the hospital due to acute cholecystitis as she is not a great operative candidate.  DISCHARGE CONDITIONS AND DIET:   Stable for discharge on soft diet  CONSULTS OBTAINED:  Treatment Team:  Lamont DowdyKolluru, Sarath, MD Dalia HeadingFath, Kenneth A, MD Ricarda FrameWoodham, Charles, MD Mick SellFitzgerald, David P, MD  DRUG ALLERGIES:  No Known Allergies  DISCHARGE MEDICATIONS:   Current Discharge Medication List    START taking these medications   Details  carvedilol (COREG) 3.125 MG tablet Take 1 tablet (3.125 mg total) by mouth 2 (two) times daily with a meal. Qty: 60 tablet, Refills: 0    ertapenem 0.5 g in sodium chloride 0.9 % 50 mL Inject 0.5 g into the vein daily. Qty: 7 g, Refills: 0    feeding supplement, ENSURE ENLIVE, (ENSURE ENLIVE) LIQD Take 237 mLs by mouth 3 (three) times daily with meals as needed (Provide if patient has poor PO intake of meal). Qty: 237 mL, Refills: 12      CONTINUE these medications which have CHANGED   Details  pravastatin (PRAVACHOL) 10  MG tablet Take 2 tablets (20 mg total) by mouth daily. Qty: 30 tablet, Refills: 0    Rivaroxaban (XARELTO) 15 MG TABS tablet Take 1 tablet (15 mg total) by mouth daily with supper. Qty: 30 tablet, Refills: 0      CONTINUE these medications which have NOT CHANGED   Details  acetaminophen (TYLENOL) 325 MG tablet Take 2 tablets (650 mg total)  by mouth every 6 (six) hours as needed for mild pain (or Fever >/= 101).    alendronate (FOSAMAX) 70 MG tablet Take 1 tablet by mouth every 30 (thirty) days.    alum & mag hydroxide-simeth (MAALOX/MYLANTA) 200-200-20 MG/5ML suspension Take 30 mLs by mouth every 6 (six) hours as needed for indigestion or heartburn.    ARIPiprazole (ABILIFY) 5 MG tablet Take 5 mg by mouth daily.    Calcium-Vitamin D 600-200 MG-UNIT per tablet Take 1 tablet by mouth 2 (two) times daily.    COMBIGAN 0.2-0.5 % ophthalmic solution Place 1 drop into both eyes daily.    desonide (DESOWEN) 0.05 % lotion Apply topically.    Emollient (CERAVE) CREA Apply 1 application topically daily.    Fluticasone-Salmeterol (ADVAIR) 100-50 MCG/DOSE AEPB Inhale 1 puff into the lungs 2 (two) times daily.    ipratropium-albuterol (DUONEB) 0.5-2.5 (3) MG/3ML SOLN Inhale 3 mLs into the lungs every 6 (six) hours as needed.    latanoprost (XALATAN) 0.005 % ophthalmic solution Place 1 drop into both eyes at bedtime.     levothyroxine (SYNTHROID, LEVOTHROID) 25 MCG tablet Take 1 tablet by mouth daily.    Multiple Vitamins-Minerals (MULTIVITAMIN WITH MINERALS) tablet Take 1 tablet by mouth daily.    omega-3 acid ethyl esters (LOVAZA) 1 G capsule Take 2 capsules by mouth 2 (two) times daily.    polyethylene glycol powder (GLYCOLAX/MIRALAX) powder Take 1 Dose by mouth daily.    Vitamin D, Ergocalciferol, (DRISDOL) 50000 units CAPS capsule Take 50,000 Units by mouth every 30 (thirty) days.    Lactobacillus (ACIDOPHILUS) CAPS capsule Take 2 capsules by mouth daily.      STOP taking these medications     enalapril (VASOTEC) 5 MG tablet      furosemide (LASIX) 20 MG tablet      lithium carbonate 150 MG capsule      metoprolol tartrate (LOPRESSOR) 25 MG tablet           Today   CHIEF COMPLAINT:   No acute issues overnight. No abdominal pain or nausea. Patient tolerating diet   VITAL SIGNS:  Blood pressure (!) 142/71,  pulse 93, temperature 98.3 F (36.8 C), temperature source Oral, resp. rate 20, height 5\' 3"  (1.6 m), weight 110.2 kg (242 lb 14.4 oz), SpO2 95 %.   REVIEW OF SYSTEMS:  Review of Systems  Constitutional: Negative.  Negative for chills, fever and malaise/fatigue.  HENT: Negative.  Negative for ear discharge, ear pain, hearing loss, nosebleeds and sore throat.   Eyes: Negative.  Negative for blurred vision and pain.  Respiratory: Negative.  Negative for cough, hemoptysis, shortness of breath and wheezing.   Cardiovascular: Negative.  Negative for chest pain, palpitations and leg swelling.  Gastrointestinal: Negative.  Negative for abdominal pain, blood in stool, diarrhea, nausea and vomiting.  Genitourinary: Negative.  Negative for dysuria.  Musculoskeletal: Negative.  Negative for back pain.  Skin: Negative.   Neurological: Negative for dizziness, tremors, speech change, focal weakness, seizures and headaches.  Endo/Heme/Allergies: Negative.  Does not bruise/bleed easily.  Psychiatric/Behavioral: Positive for memory loss. Negative for  depression, hallucinations and suicidal ideas.     PHYSICAL EXAMINATION:  GENERAL:  83 y.o.-year-old patient lying in the bed with no acute distress.  NECK:  Supple, no jugular venous distention. No thyroid enlargement, no tenderness.  LUNGS: Normal breath sounds bilaterally, no wheezing, rales,rhonchi  No use of accessory muscles of respiration.  CARDIOVASCULAR: S1, S2 normal. No murmurs, rubs, or gallops.  ABDOMEN: Soft, non-tender, non-distended. Bowel sounds present. No organomegaly or mass.  EXTREMITIES: No pedal edema, cyanosis, or clubbing.  PSYCHIATRIC: The patient is alert and oriented x 3.  SKIN: No obvious rash, lesion, or ulcer.   DATA REVIEW:   CBC  Recent Labs Lab 07/15/16 0547  WBC 12.0*  HGB 12.4  HCT 37.2  PLT 132*    Chemistries   Recent Labs Lab 07/14/16 0511 07/15/16 0547  NA 139 140  K 3.8 3.8  CL 109 111  CO2 24  24  GLUCOSE 141* 140*  BUN 70* 68*  CREATININE 2.62* 2.26*  CALCIUM 8.0* 8.3*  MG 2.1  --   AST 50*  --   ALT 96*  --   ALKPHOS 69  --   BILITOT 0.8  --     Cardiac Enzymes No results for input(s): TROPONINI in the last 168 hours.  Microbiology Results  @MICRORSLT48 @  RADIOLOGY:  Dg Chest Port 1 View  Result Date: 07/15/2016 CLINICAL DATA:  PICC line placement. EXAM: PORTABLE CHEST 1 VIEW COMPARISON:  07/13/2016. FINDINGS: PICC line has been inserted from a RIGHT arm approach. The tip lies at the innominate SVC junction or proximal SVC. Measurements suggest that the catheter needs to be advanced 4 cm to lie at the cavoatrial junction. No pneumothorax. Low lung volumes. Cardiomegaly with calcified tortuous aorta. BILATERAL lower lung opacities are stable or slightly increased. IMPRESSION: PICC line tip lies at the distal innominate or proximal SVC. Recommend advancement 4 cm and repeat imaging. Electronically Signed   By: Elsie Stain M.D.   On: 07/15/2016 13:08      Current Discharge Medication List    START taking these medications   Details  carvedilol (COREG) 3.125 MG tablet Take 1 tablet (3.125 mg total) by mouth 2 (two) times daily with a meal. Qty: 60 tablet, Refills: 0    ertapenem 0.5 g in sodium chloride 0.9 % 50 mL Inject 0.5 g into the vein daily. Qty: 7 g, Refills: 0    feeding supplement, ENSURE ENLIVE, (ENSURE ENLIVE) LIQD Take 237 mLs by mouth 3 (three) times daily with meals as needed (Provide if patient has poor PO intake of meal). Qty: 237 mL, Refills: 12      CONTINUE these medications which have CHANGED   Details  pravastatin (PRAVACHOL) 10 MG tablet Take 2 tablets (20 mg total) by mouth daily. Qty: 30 tablet, Refills: 0    Rivaroxaban (XARELTO) 15 MG TABS tablet Take 1 tablet (15 mg total) by mouth daily with supper. Qty: 30 tablet, Refills: 0      CONTINUE these medications which have NOT CHANGED   Details  acetaminophen (TYLENOL) 325 MG  tablet Take 2 tablets (650 mg total) by mouth every 6 (six) hours as needed for mild pain (or Fever >/= 101).    alendronate (FOSAMAX) 70 MG tablet Take 1 tablet by mouth every 30 (thirty) days.    alum & mag hydroxide-simeth (MAALOX/MYLANTA) 200-200-20 MG/5ML suspension Take 30 mLs by mouth every 6 (six) hours as needed for indigestion or heartburn.    ARIPiprazole (ABILIFY) 5 MG  tablet Take 5 mg by mouth daily.    Calcium-Vitamin D 600-200 MG-UNIT per tablet Take 1 tablet by mouth 2 (two) times daily.    COMBIGAN 0.2-0.5 % ophthalmic solution Place 1 drop into both eyes daily.    desonide (DESOWEN) 0.05 % lotion Apply topically.    Emollient (CERAVE) CREA Apply 1 application topically daily.    Fluticasone-Salmeterol (ADVAIR) 100-50 MCG/DOSE AEPB Inhale 1 puff into the lungs 2 (two) times daily.    ipratropium-albuterol (DUONEB) 0.5-2.5 (3) MG/3ML SOLN Inhale 3 mLs into the lungs every 6 (six) hours as needed.    latanoprost (XALATAN) 0.005 % ophthalmic solution Place 1 drop into both eyes at bedtime.     levothyroxine (SYNTHROID, LEVOTHROID) 25 MCG tablet Take 1 tablet by mouth daily.    Multiple Vitamins-Minerals (MULTIVITAMIN WITH MINERALS) tablet Take 1 tablet by mouth daily.    omega-3 acid ethyl esters (LOVAZA) 1 G capsule Take 2 capsules by mouth 2 (two) times daily.    polyethylene glycol powder (GLYCOLAX/MIRALAX) powder Take 1 Dose by mouth daily.    Vitamin D, Ergocalciferol, (DRISDOL) 50000 units CAPS capsule Take 50,000 Units by mouth every 30 (thirty) days.    Lactobacillus (ACIDOPHILUS) CAPS capsule Take 2 capsules by mouth daily.      STOP taking these medications     enalapril (VASOTEC) 5 MG tablet      furosemide (LASIX) 20 MG tablet      lithium carbonate 150 MG capsule      metoprolol tartrate (LOPRESSOR) 25 MG tablet           Management plans discussed with the patient and she is in agreement. Stable for discharge snf  Patient should follow  up with pcp  CODE STATUS:     Code Status Orders        Start     Ordered   07/12/16 0201  Do not attempt resuscitation (DNR)  Continuous    Question Answer Comment  In the event of cardiac or respiratory ARREST Do not call a "code blue"   In the event of cardiac or respiratory ARREST Do not perform Intubation, CPR, defibrillation or ACLS   In the event of cardiac or respiratory ARREST Use medication by any route, position, wound care, and other measures to relive pain and suffering. May use oxygen, suction and manual treatment of airway obstruction as needed for comfort.      07/12/16 0200    Code Status History    Date Active Date Inactive Code Status Order ID Comments User Context   06/30/2014 11:05 AM 07/05/2014  8:10 PM Full Code [id number]  Arnaldo Natal, MD Inpatient    Advance Directive Documentation     Most Recent Value  Type of Advance Directive  Out of facility DNR (pink MOST or yellow form)  Pre-existing out of facility DNR order (yellow form or pink MOST form)  -  "MOST" Form in Place?  -      TOTAL TIME TAKING CARE OF THIS PATIENT: 37 minutes.    Note: This dictation was prepared with Dragon dictation along with smaller phrase technology. Any transcriptional errors that result from this process are unintentional.  Brittanya Winburn M.D on 07/15/2016 at 2:44 PM  Between 7am to 6pm - Pager - [phone] After 6pm go to [email or web address] - password [email or web address]  [facility]  Office  [phone]  CC: Primary care physician; Patient, No Pcp Per

## 2016-07-15 NOTE — Progress Notes (Signed)
Peripherally Inserted Central Catheter/Midline Placement  The IV Nurse has discussed with the patient and/or persons authorized to consent for the patient, the purpose of this procedure and the potential benefits and risks involved with this procedure.  The benefits include less needle sticks, lab draws from the catheter, and the patient may be discharged home with the catheter. Risks include, but not limited to, infection, bleeding, blood clot (thrombus formation), and puncture of an artery; nerve damage and irregular heartbeat and possibility to perform a PICC exchange if needed/ordered by physician.  Alternatives to this procedure were also discussed.  Bard Power PICC patient education guide, fact sheet on infection prevention and patient information card has been provided to patient /or left at bedside.    PICC/Midline Placement Documentation    Consent obtained via telephone with daughter,Karen [facility] POA    Franne Gripewman, [facility] 07/15/2016, 12:46 PM

## 2016-07-15 NOTE — Progress Notes (Signed)
Infectious Disease Long Term IV Antibiotic Orders  Diagnosis:ESBL E coli bactremia, Cholecystitis, UTI  Culture results Results for orders placed or performed during the hospital encounter of 07/11/16  Blood Culture (routine x 2)     Status: Abnormal   Collection Time: 07/11/16  9:19 PM  Result Value Ref Range Status   Specimen Description BLOOD RIGHT ANTECUBITAL  Final   Special Requests   Final    BOTTLES DRAWN AEROBIC AND ANAEROBIC Blood Culture adequate volume   Culture  Setup Time   Final    IN BOTH AEROBIC AND ANAEROBIC BOTTLES GRAM NEGATIVE RODS CRITICAL RESULT CALLED TO, READ BACK BY AND VERIFIED WITH: SHEEMA HALLAJI ON 07/12/16 AT [address]    Culture (A)  Final    ESCHERICHIA COLI Confirmed Extended Spectrum Beta-Lactamase Producer (ESBL)    Report Status 07/14/2016 FINAL  Final   Organism ID, Bacteria ESCHERICHIA COLI  Final      Susceptibility   Escherichia coli - MIC*    AMPICILLIN >=32 RESISTANT Resistant     CEFAZOLIN >=64 RESISTANT Resistant     CEFEPIME RESISTANT Resistant     CEFTAZIDIME RESISTANT Resistant     CEFTRIAXONE >=64 RESISTANT Resistant     CIPROFLOXACIN >=4 RESISTANT Resistant     GENTAMICIN <=1 SENSITIVE Sensitive     IMIPENEM <=0.25 SENSITIVE Sensitive     TRIMETH/SULFA <=20 SENSITIVE Sensitive     AMPICILLIN/SULBACTAM 4 SENSITIVE Sensitive     PIP/TAZO <=4 SENSITIVE Sensitive     Extended ESBL POSITIVE Resistant     ERTAPENEM Value in next row Sensitive      SENSITIVE<=0.5Performed at [facility], [address], [place], [place] [address]    * ESCHERICHIA COLI  Blood Culture (routine x 2)     Status: Abnormal   Collection Time: 07/11/16  9:19 PM  Result Value Ref Range Status   Specimen Description BLOOD LEFT ANTECUBITAL  Final   Special Requests   Final    BOTTLES DRAWN AEROBIC AND ANAEROBIC Blood Culture adequate volume   Culture  Setup Time   Final    IN BOTH AEROBIC AND ANAEROBIC BOTTLES GRAM NEGATIVE RODS CRITICAL RESULT  CALLED TO, READ BACK BY AND VERIFIED WITH: SHEEMA HALLAJI ON 07/12/16 AT [address]    Culture (A)  Final    ESCHERICHIA COLI SUSCEPTIBILITIES PERFORMED ON PREVIOUS CULTURE WITHIN THE LAST 5 DAYS. Performed at [facility], [place] [address], [place], [place] [address]    Report Status 07/14/2016 FINAL  Final  Blood Culture ID Panel (Reflexed)     Status: Abnormal   Collection Time: 07/11/16  9:19 PM  Result Value Ref Range Status   Enterococcus species NOT DETECTED NOT DETECTED Final   Listeria monocytogenes NOT DETECTED NOT DETECTED Final   Staphylococcus species NOT DETECTED NOT DETECTED Final   Staphylococcus aureus NOT DETECTED NOT DETECTED Final   Streptococcus species NOT DETECTED NOT DETECTED Final   Streptococcus agalactiae NOT DETECTED NOT DETECTED Final   Streptococcus pneumoniae NOT DETECTED NOT DETECTED Final   Streptococcus pyogenes NOT DETECTED NOT DETECTED Final   Acinetobacter baumannii NOT DETECTED NOT DETECTED Final   Enterobacteriaceae species DETECTED (A) NOT DETECTED Final    Comment: Enterobacteriaceae represent a large family of gram-negative bacteria, not a single organism. CRITICAL RESULT CALLED TO, READ BACK BY AND VERIFIED WITH: SHEEMA HALLAJI ON 07/12/16 [place]    Enterobacter cloacae complex NOT DETECTED NOT DETECTED Final   Escherichia coli DETECTED (A) NOT DETECTED Final  Comment: CRITICAL RESULT CALLED TO, READ BACK BY AND VERIFIED WITH: SHEEMA HALLAJI ON 07/12/16 [place]    Klebsiella oxytoca NOT DETECTED NOT DETECTED Final   Klebsiella pneumoniae NOT DETECTED NOT DETECTED Final   Proteus species NOT DETECTED NOT DETECTED Final   Serratia marcescens NOT DETECTED NOT DETECTED Final   Carbapenem resistance NOT DETECTED NOT DETECTED Final   Haemophilus influenzae NOT DETECTED NOT DETECTED Final   Neisseria meningitidis NOT DETECTED NOT DETECTED Final   Pseudomonas aeruginosa NOT DETECTED NOT DETECTED Final   Candida albicans NOT DETECTED NOT DETECTED  Final   Candida glabrata NOT DETECTED NOT DETECTED Final   Candida krusei NOT DETECTED NOT DETECTED Final   Candida parapsilosis NOT DETECTED NOT DETECTED Final   Candida tropicalis NOT DETECTED NOT DETECTED Final  Gastrointestinal Panel by PCR , Stool     Status: None   Collection Time: 07/12/16 12:00 AM  Result Value Ref Range Status   Campylobacter species NOT DETECTED NOT DETECTED Final   Plesimonas shigelloides NOT DETECTED NOT DETECTED Final   Salmonella species NOT DETECTED NOT DETECTED Final   Yersinia enterocolitica NOT DETECTED NOT DETECTED Final   Vibrio species NOT DETECTED NOT DETECTED Final   Vibrio cholerae NOT DETECTED NOT DETECTED Final   Enteroaggregative E coli (EAEC) NOT DETECTED NOT DETECTED Final   Enteropathogenic E coli (EPEC) NOT DETECTED NOT DETECTED Final   Enterotoxigenic E coli (ETEC) NOT DETECTED NOT DETECTED Final   Shiga like toxin producing E coli (STEC) NOT DETECTED NOT DETECTED Final   Shigella/Enteroinvasive E coli (EIEC) NOT DETECTED NOT DETECTED Final   Cryptosporidium NOT DETECTED NOT DETECTED Final   Cyclospora cayetanensis NOT DETECTED NOT DETECTED Final   Entamoeba histolytica NOT DETECTED NOT DETECTED Final   Giardia lamblia NOT DETECTED NOT DETECTED Final   Adenovirus F40/41 NOT DETECTED NOT DETECTED Final   Astrovirus NOT DETECTED NOT DETECTED Final   Norovirus GI/GII NOT DETECTED NOT DETECTED Final   Rotavirus A NOT DETECTED NOT DETECTED Final   Sapovirus (I, II, IV, and V) NOT DETECTED NOT DETECTED Final  C difficile quick scan w PCR reflex     Status: None   Collection Time: 07/12/16 12:00 AM  Result Value Ref Range Status   C Diff antigen NEGATIVE NEGATIVE Final   C Diff toxin NEGATIVE NEGATIVE Final   C Diff interpretation No C. difficile detected.  Final  MRSA PCR Screening     Status: None   Collection Time: 07/12/16  2:10 AM  Result Value Ref Range Status   MRSA by PCR NEGATIVE NEGATIVE Final    Comment:        The GeneXpert  MRSA Assay (FDA approved for NASAL specimens only), is one component of a comprehensive MRSA colonization surveillance program. It is not intended to diagnose MRSA infection nor to guide or monitor treatment for MRSA infections.   Urine culture     Status: Abnormal   Collection Time: 07/12/16  7:15 AM  Result Value Ref Range Status   Specimen Description URINE, RANDOM  Final   Special Requests NONE  Final   Culture (A)  Final    >=100,000 COLONIES/mL ESCHERICHIA COLI Confirmed Extended Spectrum Beta-Lactamase Producer (ESBL) Performed at [facility], [place] [address] [place], [place] [address]    Report Status 07/14/2016 FINAL  Final   Organism ID, Bacteria ESCHERICHIA COLI (A)  Final      Susceptibility   Escherichia coli - MIC*    AMPICILLIN >=32 RESISTANT Resistant  CEFAZOLIN >=64 RESISTANT Resistant     CEFTRIAXONE >=64 RESISTANT Resistant     CIPROFLOXACIN >=4 RESISTANT Resistant     GENTAMICIN <=1 SENSITIVE Sensitive     IMIPENEM <=0.25 SENSITIVE Sensitive     NITROFURANTOIN <=16 SENSITIVE Sensitive     TRIMETH/SULFA <=20 SENSITIVE Sensitive     AMPICILLIN/SULBACTAM 4 SENSITIVE Sensitive     PIP/TAZO <=4 SENSITIVE Sensitive     Extended ESBL POSITIVE Resistant     * >=100,000 COLONIES/mL ESCHERICHIA COLI     Allergies: No Known Allergies  Discharge antibiotics Ertapenem       500 Grams every  24 hours  PICC Care per protocol Labs weekly while on IV antibiotics      CBC w diff   Comprehensive met panel  Planned duration of antibiotics 14 days from 6/16  Stop date 07/26/16  Follow up clinic date FU tbd FAX weekly labs to [phone]  Leonel Ramsay, MD

## 2016-07-15 NOTE — Progress Notes (Signed)
Patient is being discharged today. She remains demented. She cannot answer questions or complete a review of systems  Vital signs are stable and afebrile  No icterus no jaundice  Abdomen is soft nontender nontender calves mildly obese  Patient doing well has resolved her signs of acute cholecystitis and will follow up on an as-needed basis as she is not a very good surgical candidate.

## 2016-07-15 NOTE — Clinical Social Work Note (Signed)
Patient to discharge today after receiving her PICC line. [facility] [place] [facility] that [facility] patient knowing that she will need to be on IV ertepenem and they said they can take her. Patient returning to [place] today. Patient's daughter aware and patient to transport via EMS. Discharge information has been sent.  York SpanielMonica Laverna Dossett MSW,LCSW [phone]

## 2016-07-15 NOTE — Progress Notes (Signed)
07/15/2016 6:39 PM  BP (!) 159/93 (BP Location: Left Arm)   Pulse 73   Temp 98.2 F (36.8 C) (Oral)   Resp 17   Ht 5\' 3"  (1.6 m)   Wt 110.2 kg (242 lb 14.4 oz)   SpO2 96%   BMI 43.03 kg/m  Patient discharged per MD orders. Discharge instructions reviewed with patient and patient verbalized understanding. IV's removed per policy. Report called to Jerolyn Centerinisha, [facility] at [facility]. Patient discharged with right upper arm PICC. Dressing dry and intact. Discharged via stretcher escorted by EMS.   Ron ParkerHerron, Tameika Heckmann D, RN

## 2016-07-15 NOTE — Progress Notes (Signed)
Ok with dr Thedore Minssingh for pICC

## 2016-07-15 NOTE — Plan of Care (Signed)
Problem: Education: Goal: Knowledge of Kirsten Tran General Education information/materials will improve Outcome: Not Progressing Patient needs assistance due to past medical history.  Problem: Health Behavior/Discharge Planning: Goal: Ability to manage health-related needs will improve Outcome: Not Progressing Patient needs assistance.

## 2016-07-15 NOTE — Progress Notes (Signed)
[facility] Date of Admission:  07/11/2016     ID: Kirsten ForwardMarjorie J Tran is a 83 y.o. female with ESBL E coli bacteremia, cholecystitis and UTI  Principal Problem:   Sepsis (HCC) Active Problems:   Dementia   Pancreatitis   Cholecystitis   HTN (hypertension)   HLD (hyperlipidemia)   Hypothyroidism   AKI (acute kidney injury) (HCC)   Subjective: No fevers, denies pain   ROS  Unable to obtain  Medications:  Antibiotics Given (last 72 hours)    Date/Time Action Medication Dose Rate   07/12/16 2132 New Bag/Given   meropenem (MERREM) 1 g in sodium chloride 0.9 % 100 mL IVPB 1 g 200 mL/hr   07/13/16 1212 New Bag/Given   meropenem (MERREM) 1 g in sodium chloride 0.9 % 100 mL IVPB 1 g 200 mL/hr   07/13/16 2303 New Bag/Given   meropenem (MERREM) 1 g in sodium chloride 0.9 % 100 mL IVPB 1 g 200 mL/hr   07/14/16 1125 New Bag/Given   Ampicillin-Sulbactam (UNASYN) 3 g in sodium chloride 0.9 % 100 mL IVPB 3 g 200 mL/hr   07/14/16 1733 New Bag/Given   meropenem (MERREM) IVPB SOLR 500 mg 500 mg 100 mL/hr   07/15/16 0537 New Bag/Given   meropenem (MERREM) IVPB SOLR 500 mg 500 mg 100 mL/hr     . ARIPiprazole  5 mg Oral Daily  . brimonidine  1 drop Both Eyes Daily   And  . timolol  1 drop Both Eyes Daily  . carvedilol  3.125 mg Oral BID WC  . latanoprost  1 drop Both Eyes QHS  . levothyroxine  25 mcg Oral QAC breakfast  . mouth rinse  15 mL Mouth Rinse BID  . mometasone-formoterol  2 puff Inhalation BID  . rivaroxaban  15 mg Oral Daily  . sodium chloride flush  10-40 mL Intracatheter Q12H    Objective: Vital signs in last 24 hours: Temp:  [98.3 F (36.8 C)-98.8 F (37.1 C)] 98.3 F (36.8 C) (06/19 0631) Pulse Rate:  [77-93] 93 (06/19 0631) Resp:  [20] 20 (06/19 0631) BP: (94-142)/(62-71) 142/71 (06/19 0631) SpO2:  [95 %-98 %] 95 % (06/19 0631) Constitutional:  Obese, nad, sitting in chair HENT: [place]/AT, PERRLA, no scleral  icterus Mouth/Throat: Oropharynx is clear and moist. No oropharyngeal exudate.  Cardiovascular: Normal rate, regular rhythm and normal heart sounds. Exam reveals no gallop and no friction rub.  No murmur heard.  Pulmonary/Chest: Effort normal and breath sounds normal. No respiratory distress.  has no wheezes.  Neck = supple, no nuchal rigidity Abdominal: Soft. Bowel sounds are normal.  exhibits no distension. There is no tenderness.  Lymphadenopathy: no cervical adenopathy. No axillary adenopathy Neurological: pleasantly demented Skin: Skin is warm and dry. No rash noted. No erythema.  Psychiatric: a normal mood and affect.  behavior is normal.   Lab Results  Recent Labs  07/14/16 0511 07/15/16 0547  WBC 13.8* 12.0*  HGB 12.2 12.4  HCT 36.7 37.2  NA 139 140  K 3.8 3.8  CL 109 111  CO2 24 24  BUN 70* 68*  CREATININE 2.62* 2.26*    Microbiology: Results for orders placed or performed during the hospital encounter of 07/11/16  Blood Culture (routine x 2)     Status: Abnormal   Collection Time: 07/11/16  9:19 PM  Result Value Ref Range Status   Specimen Description BLOOD RIGHT ANTECUBITAL  Final   Special Requests   Final  BOTTLES DRAWN AEROBIC AND ANAEROBIC Blood Culture adequate volume   Culture  Setup Time   Final    IN BOTH AEROBIC AND ANAEROBIC BOTTLES GRAM NEGATIVE RODS CRITICAL RESULT CALLED TO, READ BACK BY AND VERIFIED WITH: SHEEMA HALLAJI ON 07/12/16 AT 1012 SRC    Culture (A)  Final    ESCHERICHIA COLI Confirmed Extended Spectrum Beta-Lactamase Producer (ESBL)    Report Status 07/14/2016 FINAL  Final   Organism ID, Bacteria ESCHERICHIA COLI  Final      Susceptibility   Escherichia coli - MIC*    AMPICILLIN >=32 RESISTANT Resistant     CEFAZOLIN >=64 RESISTANT Resistant     CEFEPIME RESISTANT Resistant     CEFTAZIDIME RESISTANT Resistant     CEFTRIAXONE >=64 RESISTANT Resistant     CIPROFLOXACIN >=4 RESISTANT Resistant     GENTAMICIN <=1 SENSITIVE  Sensitive     IMIPENEM <=0.25 SENSITIVE Sensitive     TRIMETH/SULFA <=20 SENSITIVE Sensitive     AMPICILLIN/SULBACTAM 4 SENSITIVE Sensitive     PIP/TAZO <=4 SENSITIVE Sensitive     Extended ESBL POSITIVE Resistant     ERTAPENEM Value in next row Sensitive      SENSITIVE<=0.5Performed at [facility], [address] [place], [place] [address]    * ESCHERICHIA COLI  Blood Culture (routine x 2)     Status: Abnormal   Collection Time: 07/11/16  9:19 PM  Result Value Ref Range Status   Specimen Description BLOOD LEFT ANTECUBITAL  Final   Special Requests   Final    BOTTLES DRAWN AEROBIC AND ANAEROBIC Blood Culture adequate volume   Culture  Setup Time   Final    IN BOTH AEROBIC AND ANAEROBIC BOTTLES GRAM NEGATIVE RODS CRITICAL RESULT CALLED TO, READ BACK BY AND VERIFIED WITH: SHEEMA HALLAJI ON 07/12/16 AT 1012 SRC    Culture (A)  Final    ESCHERICHIA COLI SUSCEPTIBILITIES PERFORMED ON PREVIOUS CULTURE WITHIN THE LAST 5 DAYS. Performed at [facility], [address], [place], [place] [address]    Report Status 07/14/2016 FINAL  Final  Blood Culture ID Panel (Reflexed)     Status: Abnormal   Collection Time: 07/11/16  9:19 PM  Result Value Ref Range Status   Enterococcus species NOT DETECTED NOT DETECTED Final   Listeria monocytogenes NOT DETECTED NOT DETECTED Final   Staphylococcus species NOT DETECTED NOT DETECTED Final   Staphylococcus aureus NOT DETECTED NOT DETECTED Final   Streptococcus species NOT DETECTED NOT DETECTED Final   Streptococcus agalactiae NOT DETECTED NOT DETECTED Final   Streptococcus pneumoniae NOT DETECTED NOT DETECTED Final   Streptococcus pyogenes NOT DETECTED NOT DETECTED Final   Acinetobacter baumannii NOT DETECTED NOT DETECTED Final   Enterobacteriaceae species DETECTED (A) NOT DETECTED Final    Comment: Enterobacteriaceae represent a large family of gram-negative bacteria, not a single organism. CRITICAL RESULT CALLED TO, READ BACK BY AND  VERIFIED WITH: SHEEMA HALLAJI ON 07/12/16 SRC    Enterobacter cloacae complex NOT DETECTED NOT DETECTED Final   Escherichia coli DETECTED (A) NOT DETECTED Final    Comment: CRITICAL RESULT CALLED TO, READ BACK BY AND VERIFIED WITH: SHEEMA HALLAJI ON 07/12/16 SRC    Klebsiella oxytoca NOT DETECTED NOT DETECTED Final   Klebsiella pneumoniae NOT DETECTED NOT DETECTED Final   Proteus species NOT DETECTED NOT DETECTED Final   Serratia marcescens NOT DETECTED NOT DETECTED Final   Carbapenem resistance NOT DETECTED NOT DETECTED Final   Haemophilus influenzae NOT DETECTED NOT DETECTED  Final   Neisseria meningitidis NOT DETECTED NOT DETECTED Final   Pseudomonas aeruginosa NOT DETECTED NOT DETECTED Final   Candida albicans NOT DETECTED NOT DETECTED Final   Candida glabrata NOT DETECTED NOT DETECTED Final   Candida krusei NOT DETECTED NOT DETECTED Final   Candida parapsilosis NOT DETECTED NOT DETECTED Final   Candida tropicalis NOT DETECTED NOT DETECTED Final  Gastrointestinal Panel by PCR , Stool     Status: None   Collection Time: 07/12/16 12:00 AM  Result Value Ref Range Status   Campylobacter species NOT DETECTED NOT DETECTED Final   Plesimonas shigelloides NOT DETECTED NOT DETECTED Final   Salmonella species NOT DETECTED NOT DETECTED Final   Yersinia enterocolitica NOT DETECTED NOT DETECTED Final   Vibrio species NOT DETECTED NOT DETECTED Final   Vibrio cholerae NOT DETECTED NOT DETECTED Final   Enteroaggregative E coli (EAEC) NOT DETECTED NOT DETECTED Final   Enteropathogenic E coli (EPEC) NOT DETECTED NOT DETECTED Final   Enterotoxigenic E coli (ETEC) NOT DETECTED NOT DETECTED Final   Shiga like toxin producing E coli (STEC) NOT DETECTED NOT DETECTED Final   Shigella/Enteroinvasive E coli (EIEC) NOT DETECTED NOT DETECTED Final   Cryptosporidium NOT DETECTED NOT DETECTED Final   Cyclospora cayetanensis NOT DETECTED NOT DETECTED Final   Entamoeba histolytica NOT DETECTED NOT DETECTED  Final   Giardia lamblia NOT DETECTED NOT DETECTED Final   Adenovirus F40/41 NOT DETECTED NOT DETECTED Final   Astrovirus NOT DETECTED NOT DETECTED Final   Norovirus GI/GII NOT DETECTED NOT DETECTED Final   Rotavirus A NOT DETECTED NOT DETECTED Final   Sapovirus (I, II, IV, and V) NOT DETECTED NOT DETECTED Final  C difficile quick scan w PCR reflex     Status: None   Collection Time: 07/12/16 12:00 AM  Result Value Ref Range Status   C Diff antigen NEGATIVE NEGATIVE Final   C Diff toxin NEGATIVE NEGATIVE Final   C Diff interpretation No C. difficile detected.  Final  MRSA PCR Screening     Status: None   Collection Time: 07/12/16  2:10 AM  Result Value Ref Range Status   MRSA by PCR NEGATIVE NEGATIVE Final    Comment:        The GeneXpert MRSA Assay (FDA approved for NASAL specimens only), is one component of a comprehensive MRSA colonization surveillance program. It is not intended to diagnose MRSA infection nor to guide or monitor treatment for MRSA infections.   Urine culture     Status: Abnormal   Collection Time: 07/12/16  7:15 AM  Result Value Ref Range Status   Specimen Description URINE, RANDOM  Final   Special Requests NONE  Final   Culture (A)  Final    >=100,000 COLONIES/mL ESCHERICHIA COLI Confirmed Extended Spectrum Beta-Lactamase Producer (ESBL) Performed at [facility], [address] [place], [place] [address]    Report Status 07/14/2016 FINAL  Final   Organism ID, Bacteria ESCHERICHIA COLI (A)  Final      Susceptibility   Escherichia coli - MIC*    AMPICILLIN >=32 RESISTANT Resistant     CEFAZOLIN >=64 RESISTANT Resistant     CEFTRIAXONE >=64 RESISTANT Resistant     CIPROFLOXACIN >=4 RESISTANT Resistant     GENTAMICIN <=1 SENSITIVE Sensitive     IMIPENEM <=0.25 SENSITIVE Sensitive     NITROFURANTOIN <=16 SENSITIVE Sensitive     TRIMETH/SULFA <=20 SENSITIVE Sensitive     AMPICILLIN/SULBACTAM 4 SENSITIVE Sensitive     PIP/TAZO <=4 SENSITIVE  Sensitive     Extended ESBL POSITIVE Resistant     * >=100,000 COLONIES/mL ESCHERICHIA COLI    Studies/Results: Dg Chest Port 1 View  Result Date: 07/15/2016 CLINICAL DATA:  Confirm PICC line placement portable chest x-ray of 07/15/2016 EXAM: PORTABLE CHEST 1 VIEW COMPARISON:  None. FINDINGS: A right-sided PICC line is present with the tip overlying the mid SVC. No pneumothorax is seen. There is atelectasis or linear scarring at both lung bases. Mild cardiomegaly is stable. Thoracic scoliosis is noted. IMPRESSION: 1. Right-sided PICC line tip overlies the mid SVC.  No pneumothorax. 2. Bibasilar linear atelectasis or scarring. Electronically Signed   By: Dwyane Dee M.D.   On: 07/15/2016 14:50   Dg Chest Port 1 View  Result Date: 07/15/2016 CLINICAL DATA:  PICC line placement. EXAM: PORTABLE CHEST 1 VIEW COMPARISON:  07/13/2016. FINDINGS: PICC line has been inserted from a RIGHT arm approach. The tip lies at the innominate SVC junction or proximal SVC. Measurements suggest that the catheter needs to be advanced 4 cm to lie at the cavoatrial junction. No pneumothorax. Low lung volumes. Cardiomegaly with calcified tortuous aorta. BILATERAL lower lung opacities are stable or slightly increased. IMPRESSION: PICC line tip lies at the distal innominate or proximal SVC. Recommend advancement 4 cm and repeat imaging. Electronically Signed   By: Elsie Stain M.D.   On: 07/15/2016 13:08    Assessment/Plan: SHAIRA SOVA is a 83 y.o. female with recurrent E coli bacteremia in setting of UTI and cholecystitis. Not a great operative candidate and surgery is not planned.  Despite the organism appearing sensitive to amp sulbactam I would not treat with any betalactams other than a carbapenem as will likely fail in vivo. Will need picc and likely 14 days IV ertapenem. This should cover the likely polymicrobial cholecystitis as well. Following this treatment may need further oral abx treatment  (bactrim/flagyl)  for her chole if no plans for surgery.   Recommendations Can dc on ertapenem once picc placed and stable See abx order sheet.  Thank you very much for the consult. Will follow with you.  Onda Kattner P   07/15/2016, 4:07 PM

## 2016-07-15 NOTE — Progress Notes (Signed)
PHARMACY CONSULT NOTE FOR:  OUTPATIENT  PARENTERAL ANTIBIOTIC THERAPY (OPAT)  Indication: bacteremia Regimen: ertapenem 500mg  q 24 hr End date: 07/26/16  IV antibiotic discharge orders are pended. To discharging provider:  please sign these orders via discharge navigator,  Select New Orders & click on the button choice - Manage This Unsigned Work.     Thank you for allowing pharmacy to be a part of this patient's care.  Olene FlossMelissa D Naasir Carreira, Pharm.D, BCPS Clinical Pharmacist  07/15/2016, 4:16 PM

## 2016-07-15 NOTE — Care Management Important Message (Signed)
Important Message  Patient Details  Name: Kirsten Tran MRN: [id number] Date of Birth: 1930/05/11   Medicare Important Message Given:  Yes    Chapman FitchBOWEN, Hector Taft T, RN 07/15/2016, 11:49 AM

## 2016-07-16 ENCOUNTER — Telehealth: Payer: Self-pay

## 2016-07-16 NOTE — Telephone Encounter (Signed)
Hospital Follow-up call made to patient at this time. Unable to contact patient due to listed number not being correct.

## 2017-04-13 ENCOUNTER — Encounter: Payer: Self-pay | Admitting: Internal Medicine

## 2017-04-13 ENCOUNTER — Ambulatory Visit (INDEPENDENT_AMBULATORY_CARE_PROVIDER_SITE_OTHER): Payer: Medicare Other | Admitting: Internal Medicine

## 2017-04-13 VITALS — BP 136/80 | HR 51 | Resp 16 | Ht 68.0 in

## 2017-04-13 DIAGNOSIS — I482 Chronic atrial fibrillation, unspecified: Secondary | ICD-10-CM

## 2017-04-13 DIAGNOSIS — Z9989 Dependence on other enabling machines and devices: Secondary | ICD-10-CM | POA: Diagnosis not present

## 2017-04-13 DIAGNOSIS — Z9981 Dependence on supplemental oxygen: Secondary | ICD-10-CM | POA: Diagnosis not present

## 2017-04-13 DIAGNOSIS — G4733 Obstructive sleep apnea (adult) (pediatric): Secondary | ICD-10-CM | POA: Diagnosis not present

## 2017-04-13 DIAGNOSIS — J449 Chronic obstructive pulmonary disease, unspecified: Secondary | ICD-10-CM | POA: Diagnosis not present

## 2017-04-13 NOTE — Progress Notes (Signed)
[facility] [address] [place], [place] [address]  Pulmonary Sleep Medicine  Office Visit Note  Patient Name: Kirsten Tran DOB: [birth year] MRN [id number]  Date of Service: 04/13/2017  Complaints/HPI:  She is here for follow-up of sleep apnea and COPD.  Patient is oxygen dependent has been on oxygen therapy in gaining benefit from.  She denies any specific complaints has been compliant with CPAP therapy as ordered.  Good saturations are noted this time.  Denies any cough congestion.  No fevers or chills are noted.  ROS  General: (-) fever, (-) chills, (-) night sweats, (-) weakness Skin: (-) rashes, (-) itching,. Eyes: (-) visual changes, (-) redness, (-) itching. Nose and Sinuses: (-) nasal stuffiness or itchiness, (-) postnasal drip, (-) nosebleeds, (-) sinus trouble. Mouth and Throat: (-) sore throat, (-) hoarseness. Neck: (-) swollen glands, (-) enlarged thyroid, (-) neck pain. Respiratory: - cough, (-) bloody sputum, + shortness of breath, - wheezing. Cardiovascular: - ankle swelling, (-) chest pain. Lymphatic: (-) lymph node enlargement. Neurologic: (-) numbness, (-) tingling. Psychiatric: (-) anxiety, (-) depression   Current Medication: Outpatient Encounter Medications as of 04/13/2017  Medication Sig  . acetaminophen (TYLENOL) 325 MG tablet Take 2 tablets (650 mg total) by mouth every 6 (six) hours as needed for mild pain (or Fever >/= 101).  Marland Kitchen alendronate (FOSAMAX) 70 MG tablet Take 1 tablet by mouth every 30 (thirty) days.  Marland Kitchen alum & mag hydroxide-simeth (MAALOX/MYLANTA) 200-200-20 MG/5ML suspension Take 30 mLs by mouth every 6 (six) hours as needed for indigestion or heartburn.  . ARIPiprazole (ABILIFY) 5 MG tablet Take 5 mg by mouth daily.  . Calcium-Vitamin D 600-200 MG-UNIT per tablet Take 1 tablet by mouth 2 (two) times daily.  . carvedilol (COREG) 3.125 MG tablet Take 1 tablet (3.125 mg total) by mouth 2 (two) times daily with a meal.  .  COMBIGAN 0.2-0.5 % ophthalmic solution Place 1 drop into both eyes daily.  Marland Kitchen desonide (DESOWEN) 0.05 % lotion Apply topically.  . Emollient (CERAVE) CREA Apply 1 application topically daily.  . feeding supplement, ENSURE ENLIVE, (ENSURE ENLIVE) LIQD Take 237 mLs by mouth 3 (three) times daily with meals as needed (Provide if patient has poor PO intake of meal).  . Fluticasone-Salmeterol (ADVAIR) 100-50 MCG/DOSE AEPB Inhale 1 puff into the lungs 2 (two) times daily.  Marland Kitchen ipratropium-albuterol (DUONEB) 0.5-2.5 (3) MG/3ML SOLN Inhale 3 mLs into the lungs every 6 (six) hours as needed.  . Lactobacillus (ACIDOPHILUS) CAPS capsule Take 2 capsules by mouth daily.  Marland Kitchen latanoprost (XALATAN) 0.005 % ophthalmic solution Place 1 drop into both eyes at bedtime.   Marland Kitchen levothyroxine (SYNTHROID, LEVOTHROID) 25 MCG tablet Take 1 tablet by mouth daily.  . Multiple Vitamins-Minerals (MULTIVITAMIN WITH MINERALS) tablet Take 1 tablet by mouth daily.  Marland Kitchen omega-3 acid ethyl esters (LOVAZA) 1 G capsule Take 2 capsules by mouth 2 (two) times daily.  . polyethylene glycol powder (GLYCOLAX/MIRALAX) powder Take 1 Dose by mouth daily.  . pravastatin (PRAVACHOL) 10 MG tablet Take 2 tablets (20 mg total) by mouth daily.  . Rivaroxaban (XARELTO) 15 MG TABS tablet Take 1 tablet (15 mg total) by mouth daily with supper.  . Vitamin D, Ergocalciferol, (DRISDOL) 50000 units CAPS capsule Take 50,000 Units by mouth every 30 (thirty) days.   No facility-administered encounter medications on file as of 04/13/2017.     Surgical History: Past Surgical History:  Procedure Laterality Date  . VAGINAL HYSTERECTOMY      Medical  History: Past Medical History:  Diagnosis Date  . Atrial fibrillation, unspecified   . Dementia   . Essential hypertension   . Hypercholesteremia   . Insomnia   . Major depression, recurrent, chronic (HCC)   . Mood disorder (HCC)   . Parkinson disease (HCC)   . Renal disorder   . Renal insufficiency   .  Schizoaffective disorder, bipolar type (HCC)   . Stroke [facility](HCC)     Family History: Family History  Family history unknown: Yes    Social History: Social History   Socioeconomic History  . Marital status: Divorced    Spouse name: Not on file  . Number of children: Not on file  . Years of education: Not on file  . Highest education level: Not on file  Social Needs  . Financial resource strain: Not on file  . Food insecurity - worry: Not on file  . Food insecurity - inability: Not on file  . Transportation needs - medical: Not on file  . Transportation needs - non-medical: Not on file  Occupational History  . Not on file  Tobacco Use  . Smoking status: Never Smoker  . Smokeless tobacco: Never Used  Substance and Sexual Activity  . Alcohol use: No  . Drug use: No  . Sexual activity: Not on file  Other Topics Concern  . Not on file  Social History Narrative  . Not on file    Vital Signs: Blood pressure 136/80, pulse (!) 51, resp. rate 16, height 5\' 8"  (1.727 m), SpO2 97 %.  Examination: General Appearance: The patient is well-developed, well-nourished, and in no distress. Skin: Gross inspection of skin unremarkable. Head: normocephalic, no gross deformities. Eyes: no gross deformities noted. ENT: ears appear grossly normal no exudates. Neck: Supple. No thyromegaly. No LAD. Respiratory:   No rhonchi or rales expansion is equal. Cardiovascular: Normal S1 and S2 without murmur or rub. Extremities: No cyanosis. pulses are equal. Neurologic: Alert and oriented. No involuntary movements.  LABS: No results found for this or any previous visit (from the past 2160 hour(s)).  Radiology: Dg Chest 2 View  Result Date: 07/11/2016 CLINICAL DATA:  Sepsis EXAM: CHEST  2 VIEW COMPARISON:  07/02/2014 FINDINGS: Chronic cardiomegaly and aortic tortuosity. Low lung volumes with no focal airspace disease. No edema, effusion, or pneumothorax. IMPRESSION: Limited low volume chest  without definitive pneumonia. Electronically Signed   By: Marnee SpringJonathon  Watts M.D.   On: 07/11/2016 19:09   [place] Renal  Result Date: 07/12/2016 CLINICAL DATA:  Acute renal failure. EXAM: RENAL / URINARY TRACT ULTRASOUND COMPLETE COMPARISON:  CT of the abdomen and pelvis on 07/01/2014 FINDINGS: Right Kidney: Length: 11.8 cm. Cyst in the midpole region is 3.3 x 3.1 x 3.8 cm. Renal parenchyma is normal echogenicity. No hydronephrosis or solid renal mass appear Left Kidney: Length: 13.0 cm.   No focal renal mass or hydronephrosis. Bladder: The bladder is decompressed by a Foley catheter. IMPRESSION: 1. Right renal cyst. 2. No hydronephrosis. 3. Foley catheter Electronically Signed   By: Norva PavlovElizabeth  Brown M.D.   On: 07/12/2016 18:29   [place] Abdomen Limited Ruq  Result Date: 07/11/2016 CLINICAL DATA:  Elevated LFTs, lipase and white count EXAM: ULTRASOUND ABDOMEN LIMITED RIGHT UPPER QUADRANT COMPARISON:  07/01/2014 FINDINGS: Gallbladder: Multiple shadowing stones within the gallbladder measuring up to 9 mm. Gallbladder wall thickening up to 5.7 mm. Positive sonographic Murphy. Common bile duct: Diameter: 3.7 mm Liver: Increased echogenicity consistent with fatty infiltration. Incidentally noted is cortical thinning of the  right kidney with cysts measuring up to 1.5 cm. IMPRESSION: 1. Multiple gallstones with wall thickening and positive sonographic Murphy sign, findings are suspicious for an acute cholecystitis. No biliary dilatation 2. Echogenic liver consistent with fatty infiltration 3. Right renal cysts Electronically Signed   By: Jasmine Pang M.D.   On: 07/11/2016 23:53    No results found.  No results found.    Assessment and Plan: Patient Active Problem List   Diagnosis Date Noted  . AKI (acute kidney injury) (HCC) 07/12/2016  . Pancreatitis 07/11/2016  . Cholecystitis 07/11/2016  . HTN (hypertension) 07/11/2016  . HLD (hyperlipidemia) 07/11/2016  . Hypothyroidism 07/11/2016  . Sepsis (HCC)  06/30/2014  . UTI (lower urinary tract infection) 06/30/2014  . Parkinson disease (HCC)   . Atrial fibrillation, unspecified   . Dementia   . Stroke (HCC) 05/14/2011    1.  COPD  Seems to be under control she will be continued on present therapy.  Patient has had noted admissions to the hospital 8.  She has been tolerating oxygen fairly well. 2. Obstructive sleep apnea-hypopnea syndrome   CPAP therapy she needed down loaded some point we will get this scheduled 3. Oxygen dependent continue with oxygen therapy at the present flow rate she has been gaining benefit from the oxygen 4. Chronic atrial fibrillation rate is controlled she follows Cardiology she will continue with Cardiology follow-up as ordered  General Counseling: I have discussed the findings of the evaluation and examination with Ollen Gross.  I have also discussed any further diagnostic evaluation thatmay be needed or ordered today. Ardean verbalizes understanding of the findings of todays visit. We also reviewed her medications today and discussed drug interactions and side effects including but not limited excessive drowsiness and altered mental states. We also discussed that there is always a risk not just to her but also people around her. she has been encouraged to call the office with any questions or concerns that should arise related to todays visit.    Time spent:  I have personally obtained a history, examined the patient, evaluated laboratory and imaging results, formulated the assessment and plan and placed orders.    Yevonne Pax, MD [facility] Pulmonary and Critical Care Sleep medicine

## 2017-04-13 NOTE — Patient Instructions (Signed)

## 2017-10-12 ENCOUNTER — Ambulatory Visit: Payer: Self-pay | Admitting: Internal Medicine

## 2019-01-08 IMAGING — US US ABDOMEN LIMITED
1 series · 14 of 25 positions shown · non-contrast
Comparison: 07/01/2014

CLINICAL DATA: Elevated LFTs, lipase and white count

EXAM:
ULTRASOUND ABDOMEN LIMITED RIGHT UPPER QUADRANT

[Series 1: us abdomen limited · 0.24mm/px · 14 of 35 slices shown]
[im 1/35]
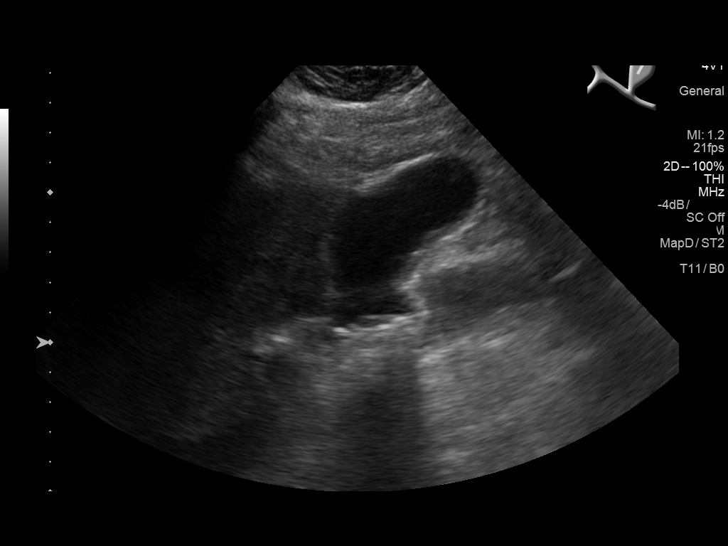
[im 3/35]
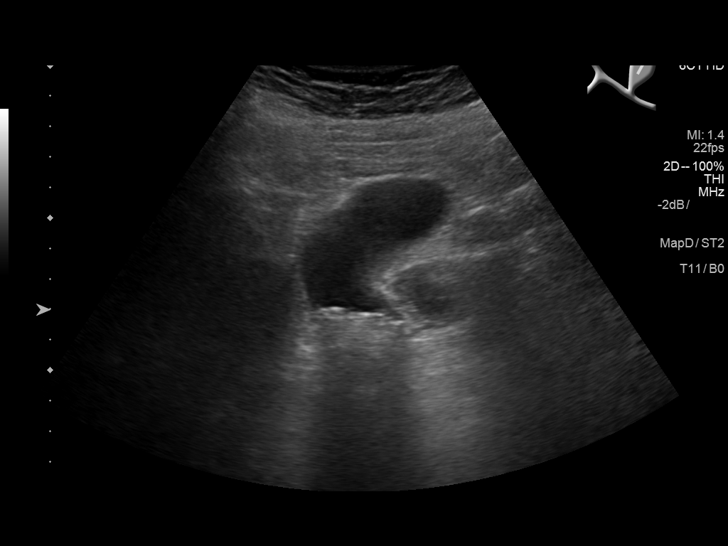
[im 6/35]
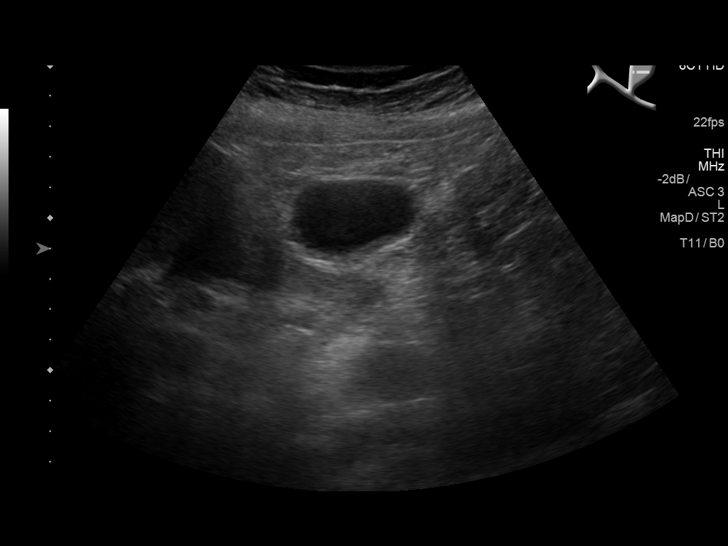
[im 9/35]
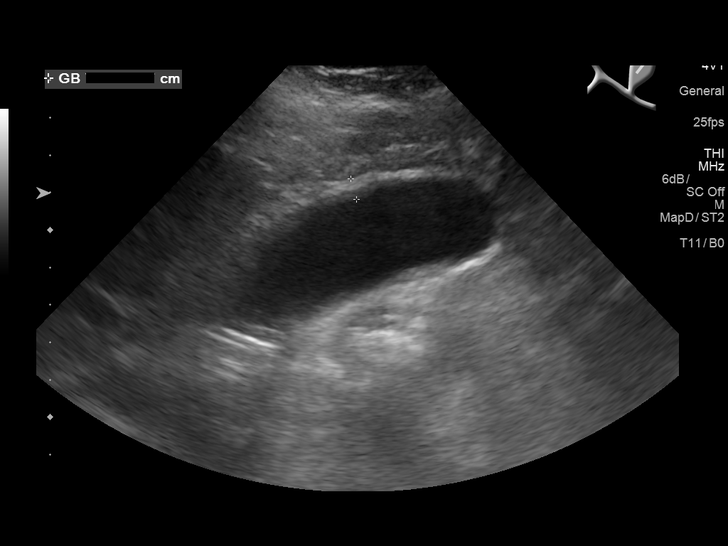
[im 12/35]
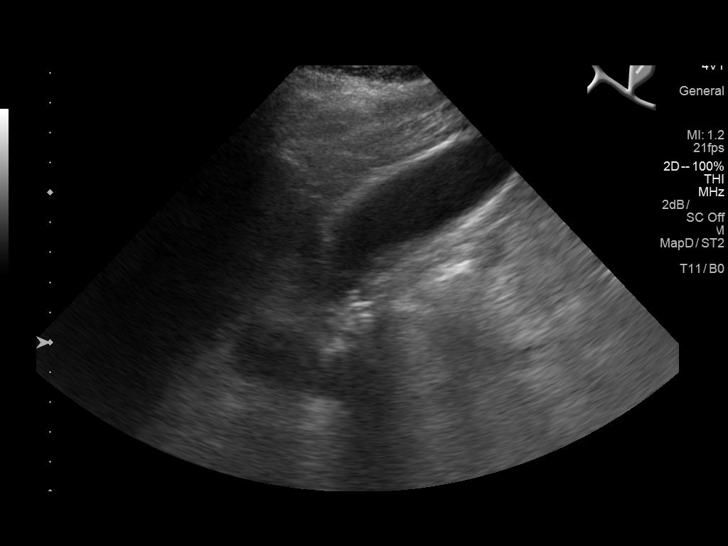
[im 13/35]
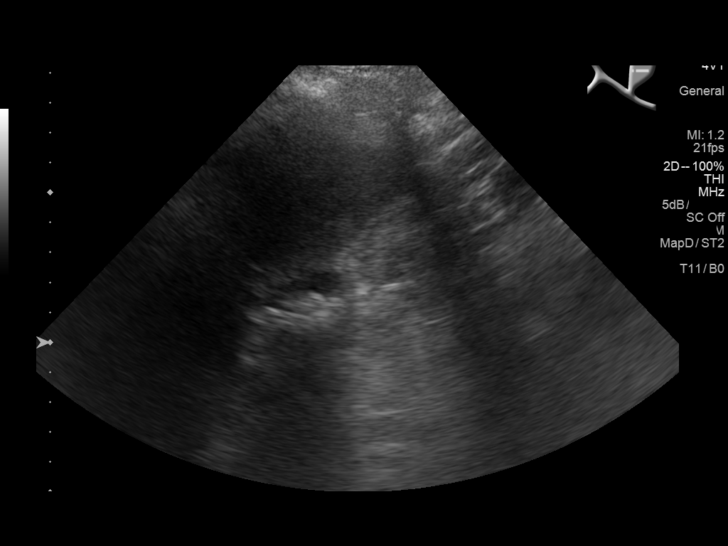
[im 16/35]
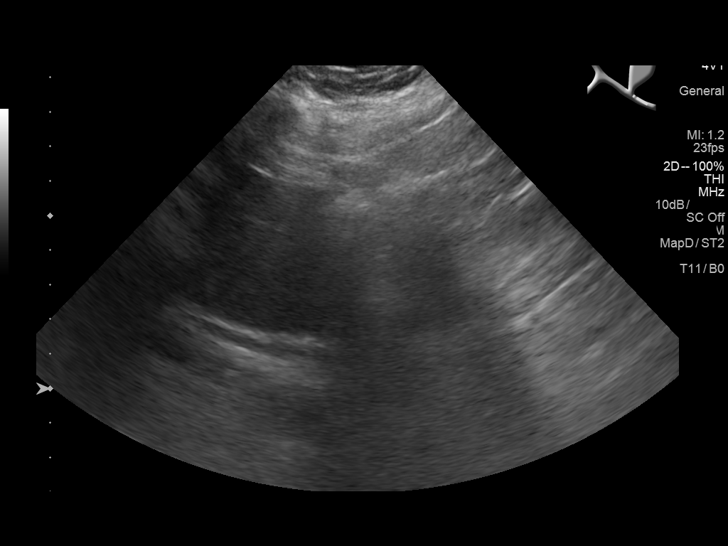
[im 19/35]
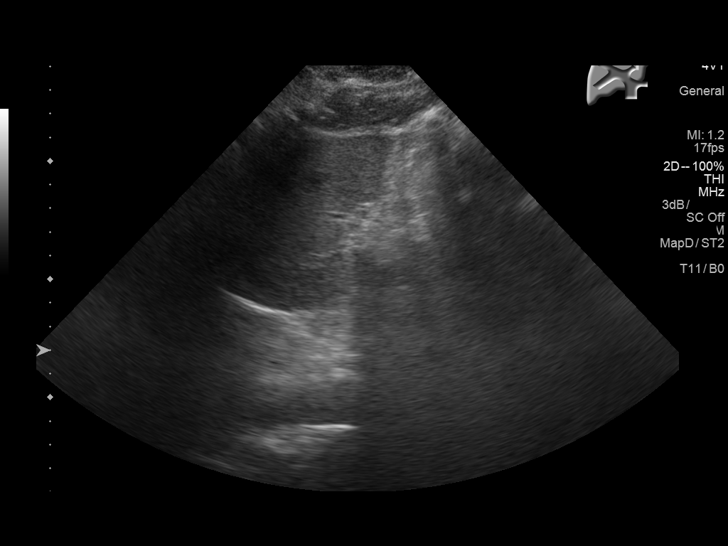
[im 22/35]
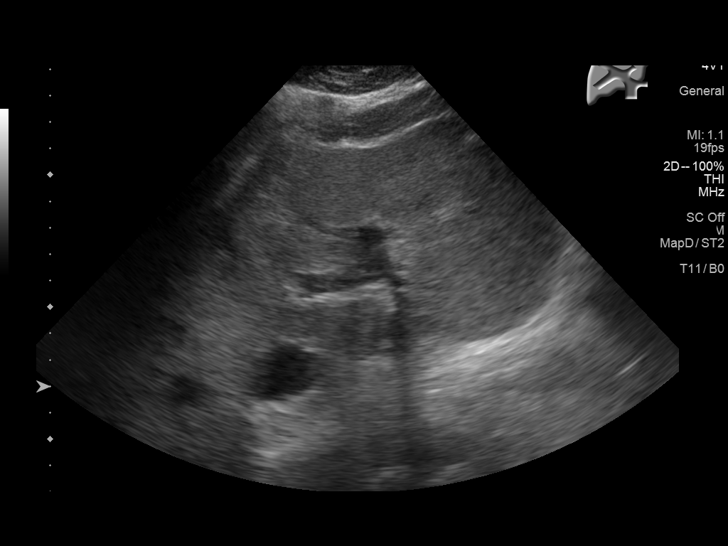
[im 23/35]
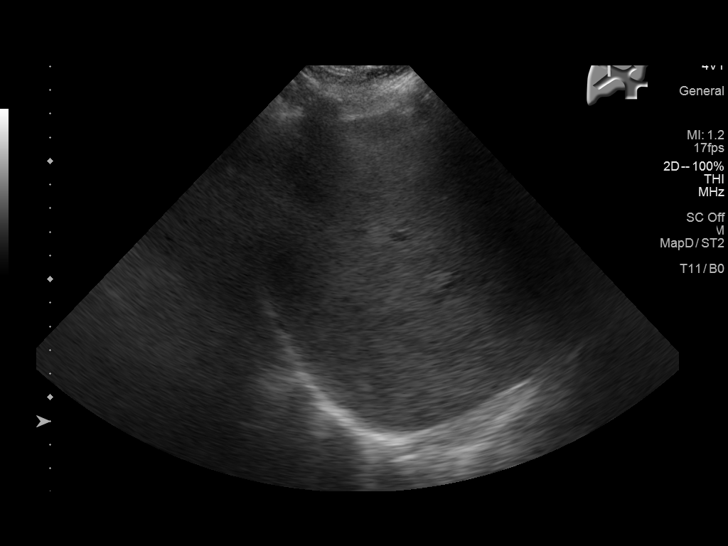
[im 26/35]
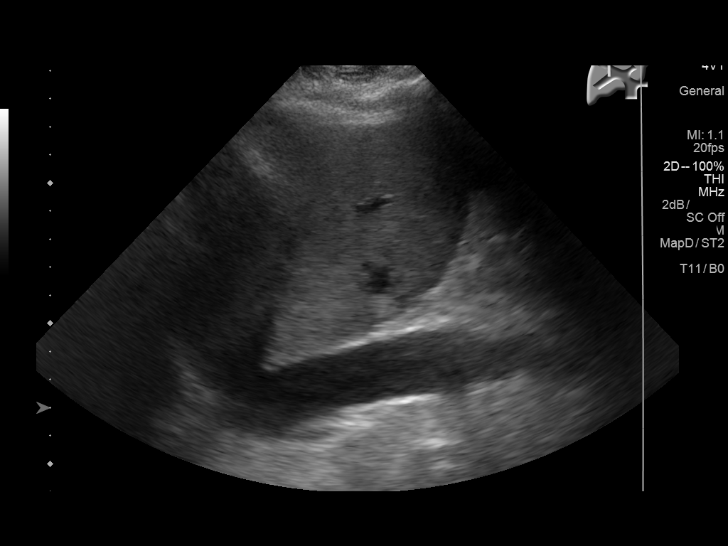
[im 29/35]
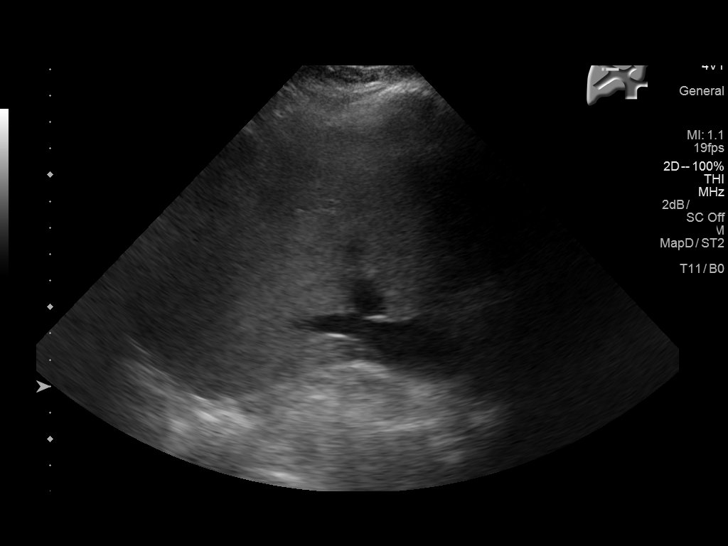
[im 32/35]
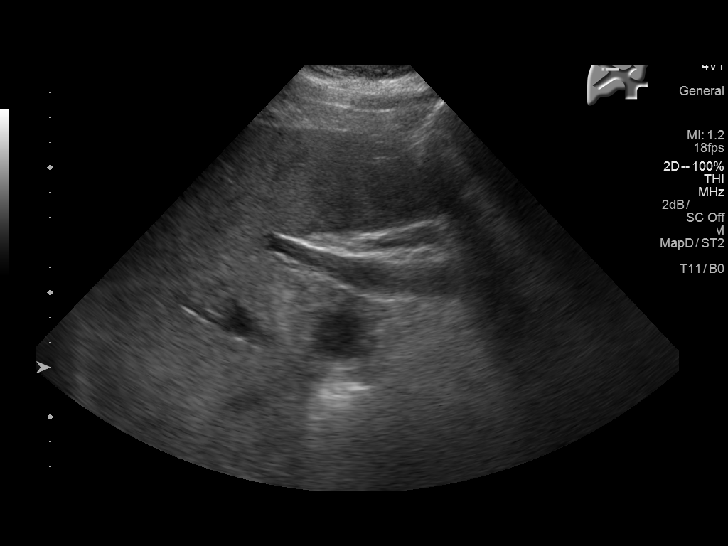
[im 35/35]
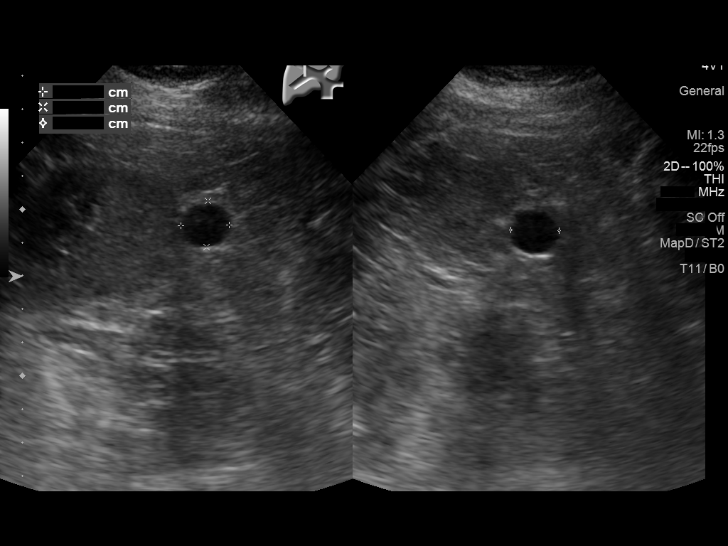

[14 of 25 positions shown; findings below may reference images not displayed]

FINDINGS: Gallbladder:

Multiple shadowing stones within the gallbladder measuring up to 9
mm. Gallbladder wall thickening up to 5.7 mm. Positive sonographic
Murphy.

Common bile duct:

Diameter: 3.7 mm

Liver:

Increased echogenicity consistent with fatty infiltration.
Incidentally noted is cortical thinning of the right kidney with
cysts measuring up to 1.5 cm.
IMPRESSION: 1. Multiple gallstones with wall thickening and positive sonographic
Murphy sign, findings are suspicious for an acute cholecystitis. No
biliary dilatation
2. Echogenic liver consistent with fatty infiltration
3. Right renal cysts

## 2019-05-08 IMAGING — DX DG CHEST 1V PORT
1 series · 1 of 1 positions shown · non-contrast
Comparison: 07/13/2016.

CLINICAL DATA: PICC line placement.

EXAM:
PORTABLE CHEST 1 VIEW

[chest ap]
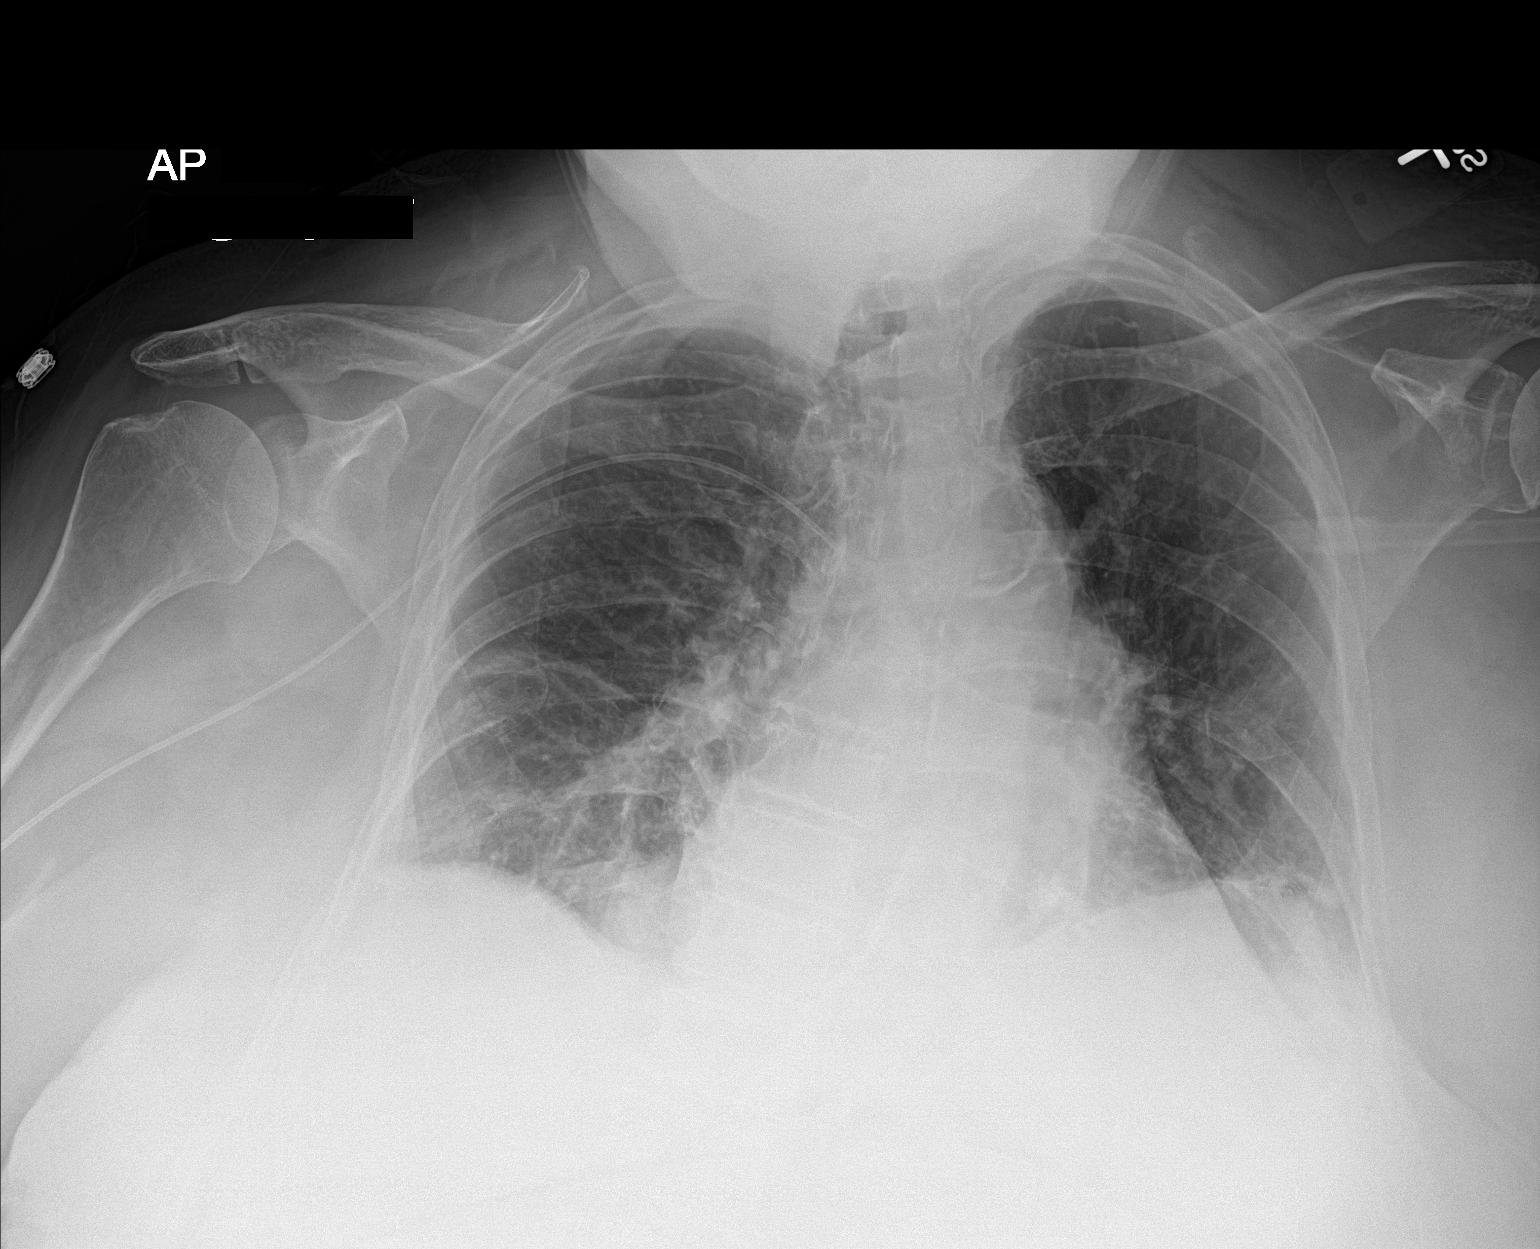

[1 of 1 positions shown; findings below may reference images not displayed]

FINDINGS: PICC line has been inserted from a RIGHT arm approach. The tip lies
at the innominate SVC junction or proximal SVC. Measurements suggest
that the catheter needs to be advanced 4 cm to lie at the cavoatrial
junction. No pneumothorax.

Low lung volumes. Cardiomegaly with calcified tortuous aorta.
BILATERAL lower lung opacities are stable or slightly increased.
IMPRESSION: PICC line tip lies at the distal innominate or proximal SVC.
Recommend advancement 4 cm and repeat imaging.
